# Patient Record
Sex: Female | Born: 1980 | Race: White | Hispanic: Yes | Marital: Single | Smoking: Never smoker
Health system: Southern US, Community
[De-identification: ages and names within clinical notes are randomized; demographics above are authoritative.]

## PROBLEM LIST (undated history)

## (undated) ENCOUNTER — Inpatient Hospital Stay (HOSPITAL_COMMUNITY): Payer: Self-pay

---
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
## 2001-03-28 ENCOUNTER — Encounter: Admission: RE | Admit: 2001-03-28 | Discharge: 2001-04-04 | Payer: Self-pay | Admitting: *Deleted

## 2006-01-26 ENCOUNTER — Ambulatory Visit: Payer: Self-pay | Admitting: Nurse Practitioner

## 2006-01-30 ENCOUNTER — Ambulatory Visit: Payer: Self-pay | Admitting: *Deleted

## 2006-04-20 ENCOUNTER — Ambulatory Visit: Payer: Self-pay | Admitting: Nurse Practitioner

## 2006-06-19 LAB — OB RESULTS CONSOLE VARICELLA ZOSTER ANTIBODY, IGG
VARICELLA IGG: IMMUNE
VARICELLA IGG: IMMUNE

## 2006-07-06 ENCOUNTER — Ambulatory Visit (HOSPITAL_COMMUNITY): Admission: RE | Admit: 2006-07-06 | Discharge: 2006-07-06 | Payer: Self-pay | Admitting: Gynecology

## 2006-07-09 ENCOUNTER — Ambulatory Visit: Payer: Self-pay | Admitting: Obstetrics & Gynecology

## 2006-07-23 ENCOUNTER — Ambulatory Visit: Payer: Self-pay | Admitting: Obstetrics & Gynecology

## 2006-07-26 ENCOUNTER — Ambulatory Visit: Payer: Self-pay | Admitting: Family Medicine

## 2006-08-02 ENCOUNTER — Ambulatory Visit: Payer: Self-pay | Admitting: *Deleted

## 2006-08-09 ENCOUNTER — Ambulatory Visit: Payer: Self-pay | Admitting: Family Medicine

## 2006-08-16 ENCOUNTER — Ambulatory Visit: Payer: Self-pay | Admitting: Family Medicine

## 2006-08-23 ENCOUNTER — Ambulatory Visit: Payer: Self-pay | Admitting: Family Medicine

## 2006-08-30 ENCOUNTER — Ambulatory Visit: Payer: Self-pay | Admitting: Gynecology

## 2006-09-06 ENCOUNTER — Ambulatory Visit (HOSPITAL_COMMUNITY): Admission: RE | Admit: 2006-09-06 | Discharge: 2006-09-06 | Payer: Self-pay | Admitting: Family Medicine

## 2006-09-06 ENCOUNTER — Ambulatory Visit: Payer: Self-pay | Admitting: Family Medicine

## 2006-09-13 ENCOUNTER — Ambulatory Visit: Payer: Self-pay | Admitting: Family Medicine

## 2006-09-20 ENCOUNTER — Ambulatory Visit: Payer: Self-pay | Admitting: Family Medicine

## 2006-09-27 ENCOUNTER — Ambulatory Visit: Payer: Self-pay | Admitting: Family Medicine

## 2006-10-04 ENCOUNTER — Ambulatory Visit: Payer: Self-pay | Admitting: Family Medicine

## 2006-10-11 ENCOUNTER — Ambulatory Visit: Payer: Self-pay | Admitting: Family Medicine

## 2006-10-17 ENCOUNTER — Ambulatory Visit: Payer: Self-pay | Admitting: Gynecology

## 2006-10-25 ENCOUNTER — Ambulatory Visit: Payer: Self-pay | Admitting: Obstetrics & Gynecology

## 2006-11-01 ENCOUNTER — Ambulatory Visit: Payer: Self-pay | Admitting: Family Medicine

## 2006-11-08 ENCOUNTER — Ambulatory Visit: Payer: Self-pay | Admitting: Family Medicine

## 2006-11-08 ENCOUNTER — Ambulatory Visit (HOSPITAL_COMMUNITY): Admission: RE | Admit: 2006-11-08 | Discharge: 2006-11-08 | Payer: Self-pay | Admitting: Family Medicine

## 2006-11-09 ENCOUNTER — Inpatient Hospital Stay (HOSPITAL_COMMUNITY): Admission: AD | Admit: 2006-11-09 | Discharge: 2006-11-12 | Payer: Self-pay | Admitting: Gynecology

## 2006-11-09 ENCOUNTER — Ambulatory Visit: Payer: Self-pay | Admitting: Gynecology

## 2007-10-10 IMAGING — US US OB COMP +14 WK
1 series · 13 of 28 positions shown · non-contrast
Comparison: none

CLINICAL DATA: 18 week 5 day gestational age by LMP.  Evaluate dating and anatomy.

[Series 1: us ob comp +14 wk · 0.27mm/px · 13 of 114 slices shown]
[im 5/114]
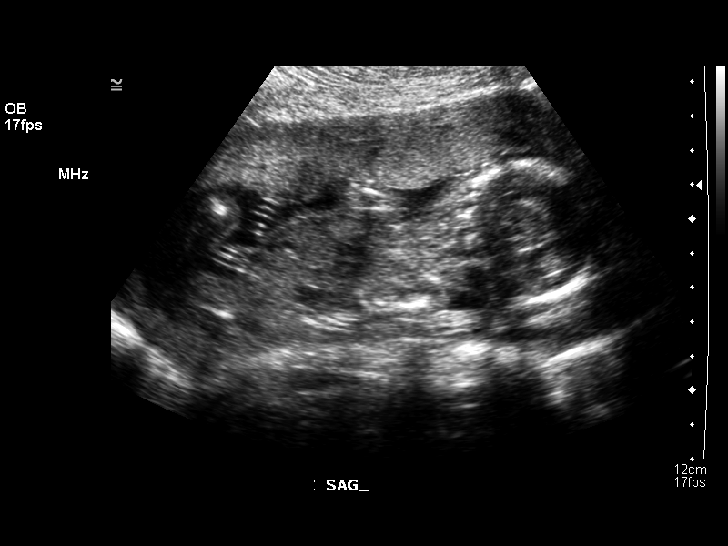
[im 13/114]
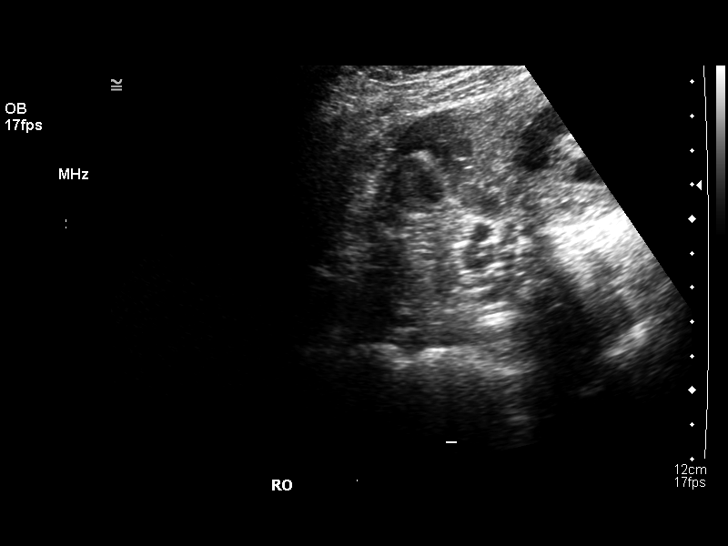
[im 21/114]
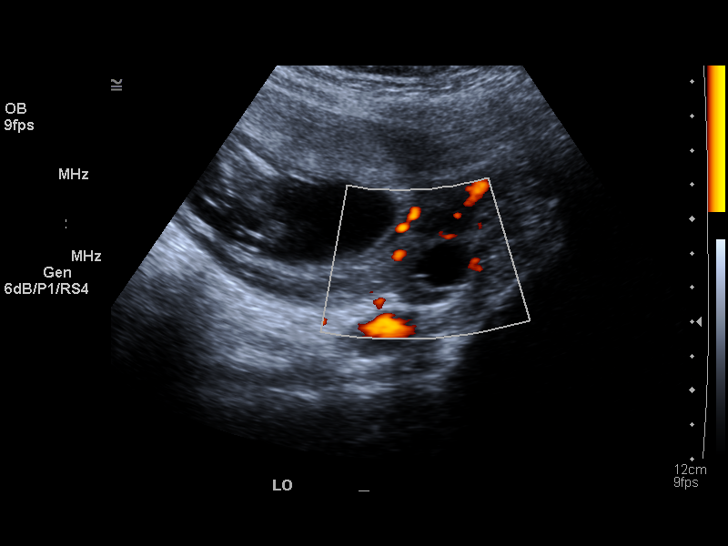
[im 30/114]
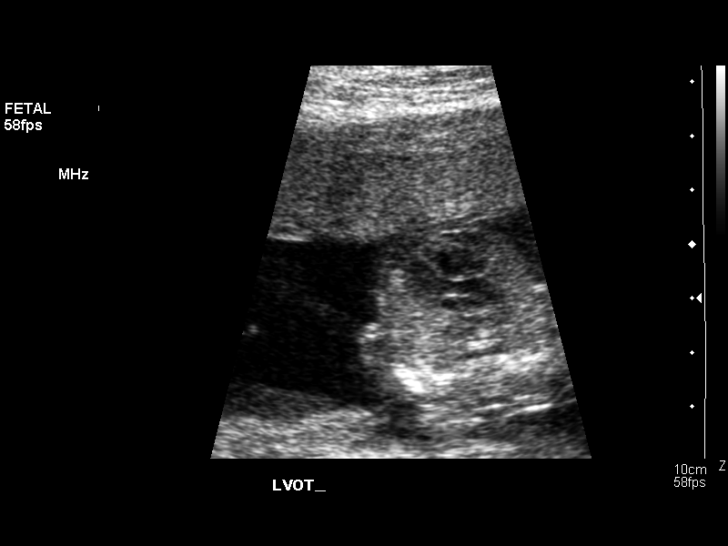
[im 38/114]
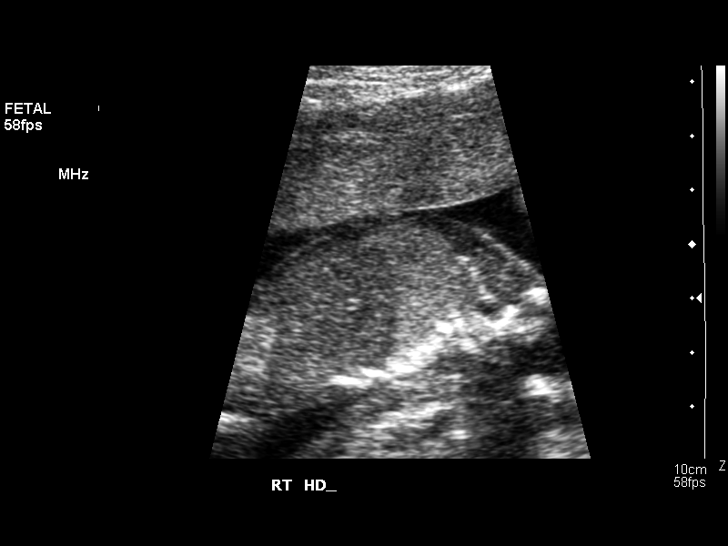
[im 47/114]
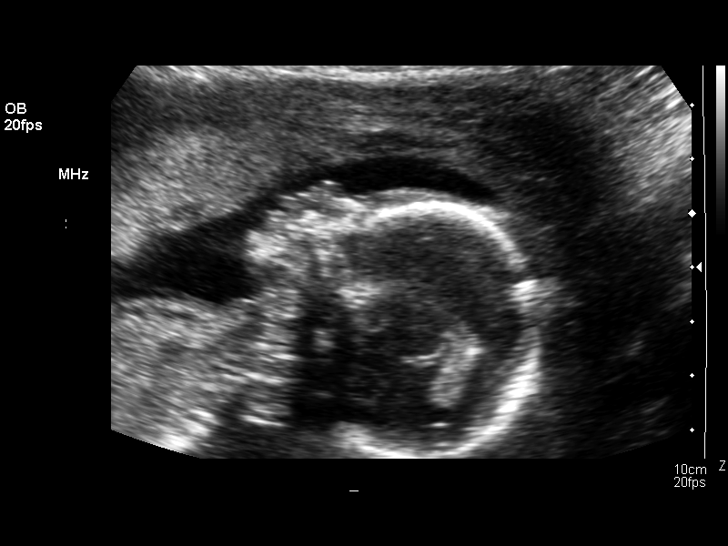
[im 59/114]
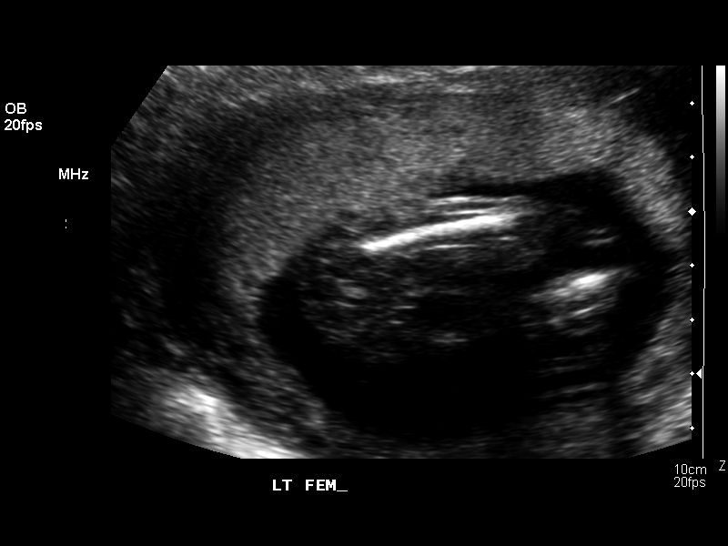
[im 67/114]
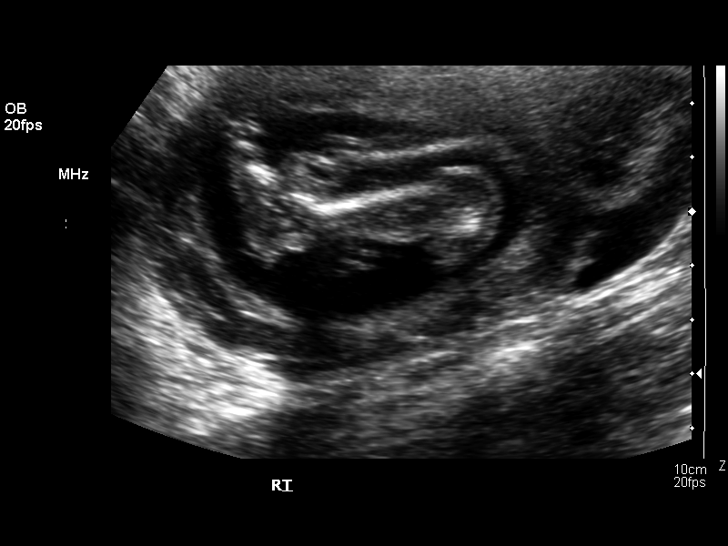
[im 76/114]
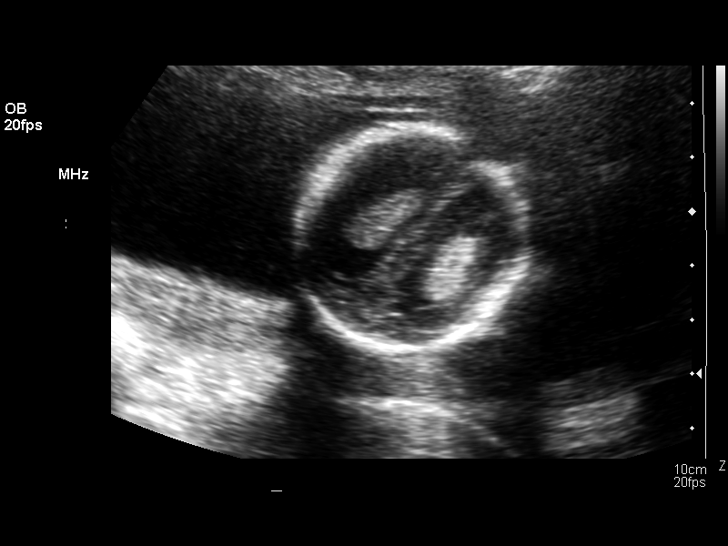
[im 84/114]
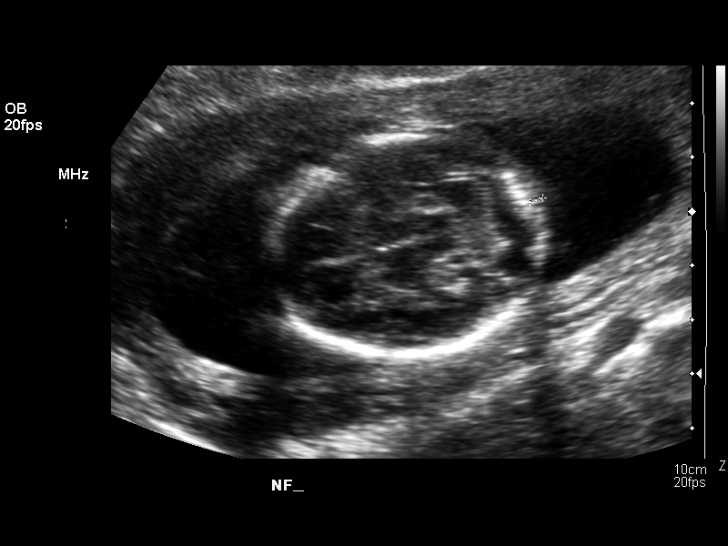
[im 93/114]
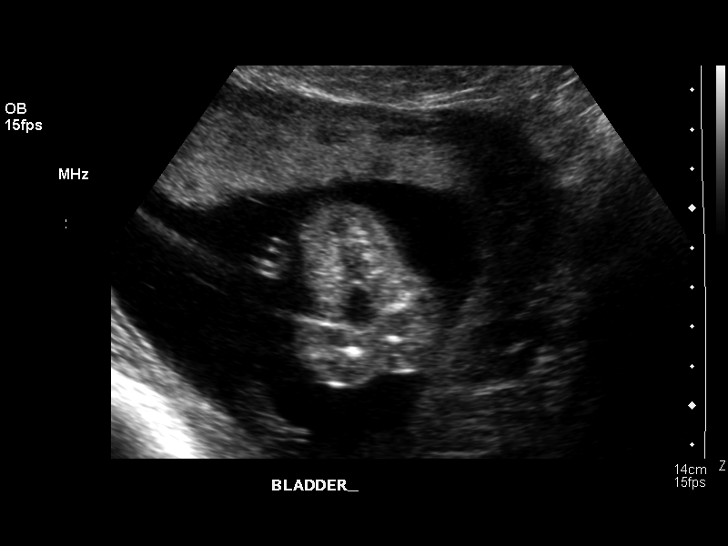
[im 101/114]
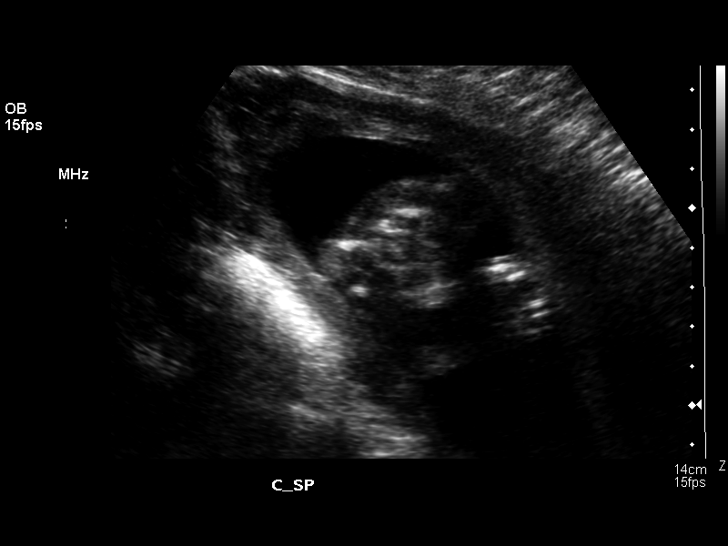
[im 109/114]
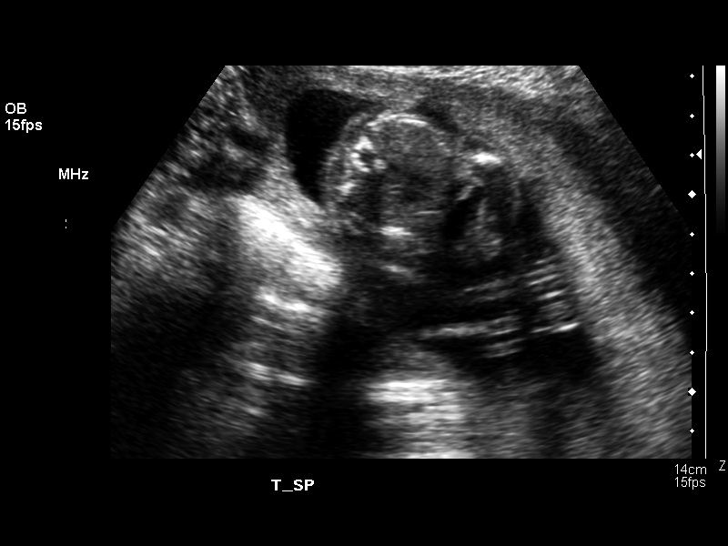

[13 of 28 positions shown; findings below may reference images not displayed]

OBSTETRICAL ULTRASOUND:
Number of Fetuses:  1
Heart Rate:  144 bpm
Movement:  Yes
Breathing:  No  
Presentation:  Variable
Placental Location:  Anterior
Grade:  I
Previa:  No
Amniotic Fluid (Subjective):  Normal
Amniotic Fluid (Objective):  3.2 cm vertical pocket 

FETAL BIOMETRY
BPD:  4.0 cm  18 w 3 d
HC:  14.8 cm  18 w 0 d
AC:  12.1 cm  17 w 6 d
FL:  2.7 cm   18 w 3 d

MEAN GA:  18 w 1 d  US EDC:  12/06/06

FETAL ANATOMY
Lateral Ventricles:  Visualized 
Thalami/CSP:  Visualized
Posterior Fossa:  Visualized   
Nuchal Region  NF = 4 mm  Visualized 
Spine:  Visualized  
4 Chamber Heart on Left:  Visualized  
Stomach on Left:  Visualized    
3 Vessel Cord:  Visualized 
Cord Insertion site:  Visualized 
Kidneys:  Visualized   
Bladder:  Visualized   
Extremities:  Visualized 

ADDITIONAL ANATOMY VISUALIZED:  LVOT, RVOT, upper lip, orbits, profile, diaphragm, both heels, ductal arch, and aortic arch.

MATERNAL UTERINE AND ADNEXAL FINDINGS
Cervix:  3.3 cm transabdominal.

Both ovaries are unremarkable.
IMPRESSION: 1.  Single living intrauterine fetus with mean gestational age of 18 weeks 1 day and sonographic EDC of 12/06/06.  This is concordant with LMP.
2.  No evidence of fetal anatomic abnormality.

## 2009-06-30 LAB — OB RESULTS CONSOLE VARICELLA ZOSTER ANTIBODY, IGG: VARICELLA IGG: IMMUNE

## 2009-07-02 ENCOUNTER — Encounter: Payer: Self-pay | Admitting: Family Medicine

## 2009-07-02 ENCOUNTER — Ambulatory Visit (HOSPITAL_COMMUNITY): Admission: RE | Admit: 2009-07-02 | Discharge: 2009-07-02 | Payer: Self-pay | Admitting: Family Medicine

## 2009-09-10 ENCOUNTER — Inpatient Hospital Stay (HOSPITAL_COMMUNITY): Admission: AD | Admit: 2009-09-10 | Discharge: 2009-09-12 | Payer: Self-pay | Admitting: Obstetrics & Gynecology

## 2009-09-10 ENCOUNTER — Encounter: Payer: Self-pay | Admitting: Physician Assistant

## 2009-09-10 ENCOUNTER — Ambulatory Visit: Payer: Self-pay | Admitting: Physician Assistant

## 2011-03-02 LAB — STREP B DNA PROBE

## 2011-03-02 LAB — URINE CULTURE

## 2011-03-02 LAB — URINALYSIS, ROUTINE W REFLEX MICROSCOPIC
Bilirubin Urine: NEGATIVE
Glucose, UA: NEGATIVE mg/dL
Hgb urine dipstick: NEGATIVE
Ketones, ur: NEGATIVE mg/dL
Nitrite: NEGATIVE
pH: 5.5 (ref 5.0–8.0)

## 2011-03-02 LAB — CBC
HCT: 35.1 % — ABNORMAL LOW (ref 36.0–46.0)
Hemoglobin: 11.9 g/dL — ABNORMAL LOW (ref 12.0–15.0)
MCHC: 33.8 g/dL (ref 30.0–36.0)
RDW: 14.2 % (ref 11.5–15.5)

## 2011-04-14 NOTE — Op Note (Signed)
[facility]  Patient:    Michelle Rice Seen                  MRN: [id number] Proc. Date: 12/22/00 Adm. Date:  [id number] Attending:  Michaelle Copas                           Operative Report  PREOPERATIVE DIAGNOSES: 1. Intrauterine pregnancy at 26-[redacted] weeks gestation in active    preterm labor. 2. Breech presentation. 3. Positive Group B strep. 4. Probable placental separation.  POSTOPERATIVE DIAGNOSES: 1. Intrauterine pregnancy at 26-[redacted] weeks gestation in active    preterm labor, delivered. 2. Breech presentation. 3. Positive Group B strep. 4. Probable placental separation. 5. Subseptate uterus.  OPERATION:                    Low transverse cesarean section.  SURGEON:                      Conni Elliot, M.D.  ANESTHESIA:                   Spinal by Dr. ______.  DESCRIPTION OF PROCEDURE:     After the patient was placed with a spinal anesthetic, the patient placed in supine left lateral tilt position.  The patient received oxygen and the abdomen was prepped and draped in the sterile fashion.  A low transverse Pfannenstiel incision was made.  Incision was made through the skin and subcutaneous fascia.  The rectus muscles were separated in the midline and a bladder flap was created.  A low transverse uterine incision was made, the baby was delivered from the footling breech presentation by complete breech extraction without difficulty.  There was no head entrapment.  The cord was doubly clamped and cut.  The baby was handed to the neonatology attendants.  The placenta was delivered spontaneously. The uterus, bladder flap, anterior peritoneum and fascia was closed in the usual fashion.  Estimated blood loss was less than 70 cc. DD:  12/22/00 TD:  12/22/00 Job: [id number]

## 2011-04-14 NOTE — Discharge Summary (Signed)
[facility]  Patient:    Michelle Rice                    MRN: [id number] Adm. Date:  12/22/00 Disc. Date: 12/25/00 Attending:  Conni Elliot, M.D. Dictator:   Ebbie Ridge, M.D.                           Discharge Summary  DISCHARGE DIAGNOSES:          1. Delivery of a single liveborn infant.                               2. Status post low cervical cesarean section.                               3. Preterm labor.                               4. Cervicitis.                               5. Double footling breech presentation.                               6. Group B streptococcus infection.                               7. Congenital anomaly of the uterus.                               8. Upper respiratory infection.  DISCHARGE MEDICATIONS:        1. Ibuprofen 600 mg p.o. q.4-6h. p.r.n.                               2. Prenatal vitamins one p.o. q.d. for six                                  weeks.                               3. Duratuss HD two teaspoons q.4h. p.r.n.                               4. Depo-Provera 150 mg IM given.  HISTORY OF PRESENT ILLNESS:   Briefly, Ms. Michelle Rice is a 30 year old, G1, P0, Hispanic female who presented to MAU at 27-6/7 weeks, complaining of lower abdominal pain.  Tocometer revealed that she was having contractions every three minutes.  Ms. Michelle Rice had been seen in the MAU five days prior and diagnosed with cervicitis.  She was found to be group B streptococcus positive.  Attempts had been made to contact the patient at home, but were unsuccessful.  PHYSICAL EXAMINATION:         The cervix was long, thick, and closed.  HOSPITAL COURSE:              Ms. Michelle Rice was admitted to the hospital and started on IV Unasyn and Motrin.  Six hours later, she progressed to 5 cm dilation, 100% effacement, and had a doming bag.  IV magnesium sulfate was started and betamethasone was given.  The patient  developed significant vaginal bleeding and labor progressed.  Ultrasound revealed double footling breech presentation.  The patient was taken to the operating room for cesarean section later that evening.  She was found to have congenital anomaly of the uterus.  There were no complications of the operation.  The patient did have significant complaints of shortness of breath postoperatively.  A chest x-ray was done and was normal.  Her symptoms were felt to be secondary to an upper respiratory infection and the patient was treated symptomatically with Robitussin and Sudafed.  On the day of discharge, Ms. Michelle Rice was doing well.  Her pain was well controlled with ibuprofen.  She was pumping breast milk for her baby who remained in the NICU.  She will follow up with [facility] in six weeks for a postpartum check. DD:  05/21/01 TD:  05/21/01 Job: [id number]

## 2015-02-08 LAB — OB RESULTS CONSOLE RUBELLA ANTIBODY, IGM: RUBELLA: IMMUNE

## 2015-02-08 LAB — OB RESULTS CONSOLE RPR: RPR: NONREACTIVE

## 2015-02-08 LAB — OB RESULTS CONSOLE ABO/RH: RH TYPE: POSITIVE

## 2015-02-08 LAB — CYTOLOGY - PAP: PAP SMEAR: NEGATIVE

## 2015-02-08 LAB — OB RESULTS CONSOLE ANTIBODY SCREEN: ANTIBODY SCREEN: NEGATIVE

## 2015-02-08 LAB — GLUCOSE TOLERANCE, 1 HOUR (50G) W/O FASTING: Glucose, GTT - 1 Hour: 125 mg/dL (ref ?–200)

## 2015-02-08 LAB — OB RESULTS CONSOLE HIV ANTIBODY (ROUTINE TESTING): HIV: NONREACTIVE

## 2015-02-08 LAB — CYSTIC FIBROSIS DIAGNOSTIC STUDY: Interpretation-CFDNA:: NEGATIVE

## 2015-02-08 LAB — OB RESULTS CONSOLE HEPATITIS B SURFACE ANTIGEN: HEP B S AG: NEGATIVE

## 2015-02-08 LAB — SICKLE CELL SCREEN: SICKLE CELL SCREEN: NORMAL

## 2015-02-08 LAB — OB RESULTS CONSOLE GC/CHLAMYDIA: GC PROBE AMP, GENITAL: NEGATIVE

## 2015-02-10 ENCOUNTER — Other Ambulatory Visit: Payer: Self-pay | Admitting: Obstetrics & Gynecology

## 2015-02-10 DIAGNOSIS — Z3682 Encounter for antenatal screening for nuchal translucency: Secondary | ICD-10-CM

## 2015-02-15 ENCOUNTER — Encounter (HOSPITAL_COMMUNITY): Payer: Self-pay

## 2015-02-15 ENCOUNTER — Ambulatory Visit (HOSPITAL_COMMUNITY)
Admission: RE | Admit: 2015-02-15 | Discharge: 2015-02-15 | Disposition: A | Discharge status: Home / Self Care | Payer: Self-pay | Source: Ambulatory Visit | Attending: Physician Assistant | Admitting: Physician Assistant

## 2015-02-15 ENCOUNTER — Ambulatory Visit (HOSPITAL_COMMUNITY): Admission: RE | Admit: 2015-02-15 | Payer: Self-pay | Source: Ambulatory Visit

## 2015-02-15 ENCOUNTER — Other Ambulatory Visit: Payer: Self-pay | Admitting: Obstetrics & Gynecology

## 2015-02-15 DIAGNOSIS — Z3682 Encounter for antenatal screening for nuchal translucency: Secondary | ICD-10-CM

## 2015-02-15 DIAGNOSIS — O3421 Maternal care for scar from previous cesarean delivery: Secondary | ICD-10-CM | POA: Insufficient documentation

## 2015-02-15 DIAGNOSIS — Z3A13 13 weeks gestation of pregnancy: Secondary | ICD-10-CM | POA: Insufficient documentation

## 2015-02-15 DIAGNOSIS — O09291 Supervision of pregnancy with other poor reproductive or obstetric history, first trimester: Secondary | ICD-10-CM | POA: Insufficient documentation

## 2015-02-15 DIAGNOSIS — Z3A01 Less than 8 weeks gestation of pregnancy: Secondary | ICD-10-CM | POA: Insufficient documentation

## 2015-02-15 DIAGNOSIS — Z36 Encounter for antenatal screening of mother: Secondary | ICD-10-CM | POA: Insufficient documentation

## 2015-02-15 NOTE — ED Notes (Signed)
Spanish interpreter Silvia Sobalvarro with pt. 

## 2015-02-15 NOTE — ED Notes (Signed)
Pt reports cramping and small amt of bright red bleeding this morning.

## 2015-02-16 ENCOUNTER — Encounter (HOSPITAL_COMMUNITY): Payer: Self-pay | Admitting: *Deleted

## 2015-02-16 ENCOUNTER — Inpatient Hospital Stay (HOSPITAL_COMMUNITY)
Admission: AD | Admit: 2015-02-16 | Discharge: 2015-02-16 | Disposition: A | Discharge status: Home / Self Care | Payer: Self-pay | Source: Ambulatory Visit | Attending: Family Medicine | Admitting: Family Medicine

## 2015-02-16 ENCOUNTER — Inpatient Hospital Stay (HOSPITAL_COMMUNITY): Payer: Self-pay

## 2015-02-16 DIAGNOSIS — O039 Complete or unspecified spontaneous abortion without complication: Secondary | ICD-10-CM

## 2015-02-16 DIAGNOSIS — O208 Other hemorrhage in early pregnancy: Secondary | ICD-10-CM | POA: Insufficient documentation

## 2015-02-16 DIAGNOSIS — Z3A01 Less than 8 weeks gestation of pregnancy: Secondary | ICD-10-CM | POA: Insufficient documentation

## 2015-02-16 DIAGNOSIS — R109 Unspecified abdominal pain: Secondary | ICD-10-CM

## 2015-02-16 DIAGNOSIS — O209 Hemorrhage in early pregnancy, unspecified: Secondary | ICD-10-CM

## 2015-02-16 DIAGNOSIS — O26899 Other specified pregnancy related conditions, unspecified trimester: Secondary | ICD-10-CM

## 2015-02-16 LAB — WET PREP, GENITAL
CLUE CELLS WET PREP: NONE SEEN
Trich, Wet Prep: NONE SEEN
Yeast Wet Prep HPF POC: NONE SEEN

## 2015-02-16 LAB — CBC
HEMATOCRIT: 35.3 % — AB (ref 36.0–46.0)
Hemoglobin: 11.7 g/dL — ABNORMAL LOW (ref 12.0–15.0)
MCH: 28.5 pg (ref 26.0–34.0)
MCHC: 33.1 g/dL (ref 30.0–36.0)
MCV: 86.1 fL (ref 78.0–100.0)
PLATELETS: 235 10*3/uL (ref 150–400)
RBC: 4.1 MIL/uL (ref 3.87–5.11)
RDW: 13.5 % (ref 11.5–15.5)
WBC: 9.5 10*3/uL (ref 4.0–10.5)

## 2015-02-16 LAB — ABO/RH: ABO/RH(D): B POS

## 2015-02-16 MED ORDER — IBUPROFEN 200 MG PO CAPS: 600.0000 mg | ORAL_CAPSULE | Freq: Four times a day (QID) | ORAL | Status: DC | PRN

## 2015-02-16 NOTE — Discharge Instructions (Signed)
Aborto espontáneo  °(Miscarriage) °El aborto espontáneo es la pérdida de un bebé que no ha nacido (feto) antes de la semana 20 del embarazo. La mayor parte de estos abortos ocurre en los primeros 3 meses. En algunos casos ocurre antes de que la mujer sepa que está embarazada. También se denomina "aborto espontáneo" o "pérdida prematura del embarazo". El aborto espontáneo puede ser una experiencia que afecte emocionalmente a la persona. Converse con su médico si tiene dudas, cómo es el proceso de duelo, y sobre planes futuros de embarazo.  °CAUSAS  °· Algunos problemas cromosómicos pueden hacer imposible que el bebé se desarrolle normalmente. Los problemas con los genes o cromosomas del bebé son generalmente el resultado de errores que se producen, por casualidad, cuando el embrión se divide y crece. Estos problemas no se heredan de los padres. °· Infección en el cuello del útero.   °· Problemas hormonales.   °· Problemas en el cuello del útero, como tener un útero incompetente. Esto ocurre cuando los tejidos no son lo suficientemente fuertes como para contener el embarazo.   °· Problemas del útero, como un útero con forma anormal, los fibromas o anormalidades congénitas.   °· Ciertas enfermedades crónicas.   °· No fume, no beba alcohol, ni consuma drogas.   °· Traumatismos   °A veces, la causa es desconocida.  °SÍNTOMAS  °· Sangrado o manchado vaginal, con o sin cólicos o dolor. °· Dolor o cólicos en el abdomen o en la cintura. °· Eliminación de líquido, tejidos o coágulos grandes por la vagina. °DIAGNÓSTICO  °El médico le hará un examen físico. También le indicará una ecografía para confirmar el aborto. Es posible que se realicen análisis de sangre.  °TRATAMIENTO  °· En algunos casos el tratamiento no es necesario, si se eliminan naturalmente todos los tejidos embrionarios que se encontraban en el útero. Si el feto o la placenta quedan dentro del útero (aborto incompleto), pueden infectarse, los tejidos que quedan  pueden infectarse y deben retirarse. Generalmente se realiza un procedimiento de dilatación y curetaje (D y C). Durante el procedimiento de dilatación y curetaje, el cuello del útero se abre (dilata) y se retira cualquier resto de tejido fetal o placentario del útero. °· Si hay una infección, le recetarán antibióticos. Podrán recetarle otros medicamentos para reducir el tamaño del útero (contraerlo) si hay una mucho sangrado. °· Si su sangre es Rh negativa y su bebé es Rh positivo, usted necesitará la inyección de inmunoglobulina Rh. Esta inyección protegerá a los futuros bebés de tener problemas de compatibilidad Rh en futuros embarazos. °INSTRUCCIONES PARA EL CUIDADO EN EL HOGAR  °· El médico le indicará reposo en cama o le permitirá realizar actividades livianas. Vuelva a la actividad lentamente o según las indicaciones de su médico. °· Pídale a alguien que la ayude con las responsabilidades familiares y del hogar durante este tiempo.   °· Lleve un registro de la cantidad y la saturación de las toallas higiénicas que utiliza cada día. Anote esta información   °· No use tampones. No No se haga duchas vaginales ni tenga relaciones sexuales hasta que el médico la autorice.   °· Sólo tome medicamentos de venta libre o recetados para calmar el dolor o el malestar, según las indicaciones de su médico.   °· No tome aspirina. La aspirina puede ocasionar hemorragias.   °· Concurra puntualmente a las citas de control con el médico.   °· Si usted o su pareja tienen dificultades con el duelo, hable con su médico para buscar la ayuda psicológica que los ayude a enfrentar la pérdida   del embarazo. Permítase el tiempo suficiente de duelo antes de quedar embarazada nuevamente.   °SOLICITE ATENCIÓN MÉDICA DE INMEDIATO SI:  °· Siente calambres intensos o dolor en la espalda o en el abdomen. °· Tiene fiebre. °· Elimina grandes coágulos de sangre (del tamaño de una nuez o más) o tejidos por la vagina. Guarde lo que ha eliminado para  que su médico lo examine.   °· La hemorragia aumenta.   °· Observa una secreción vaginal espesa y con mal olor. °· Se siente mareada, débil, o se desmaya.   °· Siente escalofríos.   °ASEGÚRESE DE QUE:  °· Comprende estas instrucciones. °· Controlará su enfermedad. °· Solicitará ayuda de inmediato si no mejora o si empeora. °Document Released: 08/23/2005 Document Revised: 03/10/2013 °ExitCare® Patient Information ©2015 [employer]. This information is not intended to replace advice given to you by your health care provider. Make sure you discuss any questions you have with your health care provider. ° °

## 2015-02-16 NOTE — MAU Note (Addendum)
PT WAS  IN MFM YESTERDAY  FOR AN U/S- AFTER GOING  TO   CLINIC - ASKING  THEM WHAT  TO DO ABOUT HER BLEEDING.    SHE WAS SEEN AT HD ON 3-14  --- MADE APPOINTMENT  WITH MFM.     HAS AN APPOINTMENT  WITH CLINIC  ON 4-7         IN ROOM 3-  NO BLEEDING ON PERINEUM  BUT  HAS DRIED BLOOD SMEARS ON THIGHS.     SAYS SHE STARTED VAG BLEEDING YESTERDAY AM.   MINOR CRAMPS  YESTERDAY BUT MORE CRAMPS AND BLEEDING  TODAY.     NOW  WITH INTERPRETER - EDA-  SAYS SHE HAS PASSED CLOTS- WITH JELLO LIKE  SUBSTANCE IN CLOTS.

## 2015-02-16 NOTE — MAU Provider Note (Deleted)
History     CSN: [id number]  Arrival date and time: 02/16/15 [id number]   First Provider Initiated Contact with Patient 02/16/15 [phone]      No chief complaint on file.  HPI  Michelle Rice is a 34 y.o.HF [id number], at 3860w6d presenting with pelvic cramping and vaginal bleeding since yesterday morning. She reports a heavier flow of bleeding and clots that has caused her to change her pad 5-6 times since yesterday. The pelvic cramping feels exactly like the contractions she felt when she had her 3 children previously. Contractions are occuring every few minutes and have been occurring while in MAU. Pt had a transvaginal US yesterday for the same symptoms. A small fetal pole was noted with no fetal heart activity.      OB History    Gravida Para Term Preterm AB TAB SAB Ectopic Multiple Living   4 3 1 2      3       Past Medical History  Diagnosis Date  . Medical history non-contributory   . Preterm labor     Past Surgical History  Procedure Laterality Date  . Cesarean section      Family History  Problem Relation Age of Onset  . Hypertension Father     History  Substance Use Topics  . Smoking status: Never Smoker   . Smokeless tobacco: Not on file  . Alcohol Use: No    Allergies: No Known Allergies  Prescriptions prior to admission  Medication Sig Dispense Refill Last Dose  . Prenatal Vit-Fe Fumarate-FA (PRENATAL VITAMIN PO) Take 1 tablet by mouth daily.    02/16/2015 at Unknown time    Review of Systems  Constitutional: Negative.  Negative for fever, chills and weight loss.  HENT: Negative.   Eyes: Negative.   Respiratory: Negative for cough and wheezing.   Cardiovascular: Negative for chest pain, palpitations, orthopnea and leg swelling.  Gastrointestinal: Negative.   Genitourinary: Negative for dysuria, urgency and frequency.  Musculoskeletal: Negative for myalgias.       Describes pelvic contractions every few minutes.  Skin: Negative.   Neurological:  Negative.  Negative for headaches.  Endo/Heme/Allergies: Negative.   Psychiatric/Behavioral: Negative.    Physical Exam   Blood pressure 98/70, pulse 60, temperature 98.3 F (36.8 C), temperature source Oral, resp. rate 18, height 5\' 1"  (1.549 m), weight 81.307 kg (179 lb 4 oz), last menstrual period 11/16/2014.  Physical Exam  Constitutional: She is oriented to person, place, and time. She appears well-developed and well-nourished. No distress.  Overweight  HENT:  Head: Normocephalic and atraumatic.  Eyes: Conjunctivae and EOM are normal. Pupils are equal, round, and reactive to light.  Neck: Normal range of motion. Neck supple.  Cardiovascular: Normal rate, regular rhythm, normal heart sounds and intact distal pulses.   Respiratory: Effort normal and breath sounds normal.  GI: Soft. Bowel sounds are normal. She exhibits no distension and no mass. There is no tenderness. There is no rebound and no guarding. Hernia confirmed negative in the right inguinal area and confirmed negative in the left inguinal area.  Genitourinary: Uterus normal. There is no rash, tenderness, lesion or injury on the right labia. There is no rash, tenderness, lesion or injury on the left labia. Cervix exhibits discharge. Cervix exhibits no motion tenderness and no friability. Right adnexum displays no mass, no tenderness and no fullness. Left adnexum displays no mass, no tenderness and no fullness. There is bleeding in the vagina. No erythema or tenderness in the vagina. No  foreign body around the vagina. No signs of injury around the vagina. No vaginal discharge found.  Copious amounts of blood in vagina and bleeding noted from cervix. 15 cotton swabs used in order to visualize cervix. No fetal contents noted to be coming from cervical os at first look.  Musculoskeletal: Normal range of motion. She exhibits no edema.  Lymphadenopathy:    She has no cervical adenopathy.  Neurological: She is alert and oriented to  person, place, and time.  Skin: Skin is warm and dry. She is not diaphoretic.  Psychiatric: She has a normal mood and affect. Her behavior is normal.    MAU Course  Procedures  MDM    Assessment and Plan    Acie Fredrickson 02/16/2015, 9:31 AM

## 2015-02-17 LAB — GC/CHLAMYDIA PROBE AMP (~~LOC~~) NOT AT ARMC
CHLAMYDIA, DNA PROBE: NEGATIVE
NEISSERIA GONORRHEA: NEGATIVE

## 2015-02-22 ENCOUNTER — Ambulatory Visit (HOSPITAL_COMMUNITY): Admission: RE | Admit: 2015-02-22 | Payer: Self-pay | Source: Ambulatory Visit

## 2015-02-22 ENCOUNTER — Encounter: Payer: Self-pay | Admitting: *Deleted

## 2015-02-23 NOTE — MAU Provider Note (Signed)
History     CSN: [id number]  Arrival date and time: 02/16/15 0865   First Provider Initiated Contact with Patient 02/16/15 [phone]      No chief complaint on file.  HPI  Michelle Rice is a 34 y.o.HF [id number], at [redacted]w[redacted]d presenting with pelvic cramping and vaginal bleeding since yesterday morning. She reports a heavier flow of bleeding and clots that has caused her to change her pad 5-6 times since yesterday. Denies passage of tissue. The pelvic cramping feels exactly like the contractions she felt when she had her 3 children previously. Contractions are occuring every few minutes and have been occurring while in MAU. Pt had a transvaginal US yesterday for the same symptoms. A 5.6 week fetal pole was noted with no fetal heart activity.  B pos   OB History    Gravida Para Term Preterm AB TAB SAB Ectopic Multiple Living   Past Medical History  Diagnosis Date  . Medical history non-contributory   . Preterm labor     Past Surgical History  Procedure Laterality Date  . Cesarean section      Family History  Problem Relation Age of Onset  . Hypertension Father     History  Substance Use Topics  . Smoking status: Never Smoker   . Smokeless tobacco: Not on file  . Alcohol Use: No    Allergies: No Known Allergies  Prescriptions prior to admission  Medication Sig Dispense Refill Last Dose  . Prenatal Vit-Fe Fumarate-FA (PRENATAL VITAMIN PO) Take 1 tablet by mouth daily.    02/16/2015 at Unknown time    Review of Systems  Constitutional: Negative.  Negative for fever, chills and weight loss.  HENT: Negative.   Eyes: Negative.   Respiratory: Negative for cough and wheezing.   Cardiovascular: Negative for chest pain, palpitations, orthopnea and leg swelling.  Gastrointestinal: Negative.   Genitourinary: Negative for dysuria, urgency and frequency.  Musculoskeletal: Negative for myalgias.       Describes pelvic contractions every few minutes.   Skin: Negative.   Neurological: Negative.  Negative for headaches.  Endo/Heme/Allergies: Negative.   Psychiatric/Behavioral: Negative.    Physical Exam   Blood pressure 98/70, pulse 60, temperature 98.3 F (36.8 C), temperature source Oral, resp. rate 18, height  (1.549 m), weight 81.307 kg (179 lb 4 oz), last menstrual period 11/16/2014.  Physical Exam  Constitutional: She is oriented to person, place, and time. She appears well-developed and well-nourished. No distress.  Overweight  HENT:  Head: Normocephalic and atraumatic.  Eyes: Conjunctivae and EOM are normal. Pupils are equal, round, and reactive to light.  Neck: Normal range of motion. Neck supple.  Cardiovascular: Normal rate, regular rhythm, normal heart sounds and intact distal pulses.   Respiratory: Effort normal and breath sounds normal.  GI: Soft. Bowel sounds are normal. She exhibits no distension and no mass. There is no tenderness. There is no rebound and no guarding. Hernia confirmed negative in the right inguinal area and confirmed negative in the left inguinal area.  Genitourinary: Uterus normal. There is no rash, tenderness, lesion or injury on the right labia. There is no rash, tenderness, lesion or injury on the left labia. Cervix exhibits discharge. Cervix exhibits no motion tenderness and no friability. Right adnexum displays no mass, no tenderness and no fullness. Left adnexum displays no mass, no tenderness and no fullness. There is bleeding in the vagina. No erythema or  tenderness in the vagina. No foreign body around the vagina. No signs of injury around the vagina. No vaginal discharge found.  Copious amounts of blood in vagina and bleeding noted from cervix. 15 cotton swabs used in order to visualize cervix. No fetal contents noted to be coming from cervical os at first look.  Musculoskeletal: Normal range of motion. She exhibits no edema.  Lymphadenopathy:    She has no cervical adenopathy.   Neurological: She is alert and oriented to person, place, and time.  Skin: Skin is warm and dry. She is not diaphoretic.  Psychiatric: She has a normal mood and affect. Her behavior is normal.   Results for Naoma DienerGARCIA-ALTAMIRANO, Sunita (MRN [id number]) as of 02/23/2015 14:14  Ref. Range 02/16/2015 09:25 02/16/2015 09:47  WBC Latest Range: 4.0-10.5 K/uL 9.5   RBC Latest Range: 3.87-5.11 MIL/uL 4.10   Hemoglobin Latest Range: 12.0-15.0 g/dL 96.211.7 (L)   HCT Latest Range: 36.0-46.0 % 35.3 (L)   MCV Latest Range: 78.0-100.0 fL 86.1   MCH Latest Range: 26.0-34.0 pg 28.5   MCHC Latest Range: 30.0-36.0 g/dL 95.233.1   RDW Latest Range: 11.5-15.5 % 13.5   Platelets Latest Range: 150-400 K/uL 235   ABO/RH(D) No range found B POS   Yeast Wet Prep HPF POC Latest Range: NONE SEEN   NONE SEEN  Trich, Wet Prep Latest Range: NONE SEEN   NONE SEEN  Clue Cells Wet Prep HPF POC Latest Range: NONE SEEN   NONE SEEN  WBC, Wet Prep HPF POC Latest Range: NONE SEEN   FEW (A)   [place] Ob Transvaginal  02/16/2015   CLINICAL DATA:  Vaginal bleeding. Passing clots. Quantitative beta HCG not ordered. Gravida 4 para 3. LMP 11/16/2014. GA by LMP is 13 weeks 1 day. EDC by LMP is 08/23/2015. By first ultrasound patient is 5 weeks 6 days and has EDC of 10/13/2015.  EXAM: TRANSVAGINAL OB ULTRASOUND  TECHNIQUE: Transvaginal ultrasound was performed for complete evaluation of the gestation as well as the maternal uterus, adnexal regions, and pelvic cul-de-sac.  COMPARISON:  02/15/2015  FINDINGS: Intrauterine gestational sac: Present within the cervical canal.  Yolk sac:  Not seen  Embryo:  Not seen  Cardiac Activity: Not seen  A 3.0 x 1.2 x 2.2 cm sac is identified within the cervical canal. Yolk sac and embryo are no longer apparent. Above the sac is heterogeneous endometrial contents, measuring 2.4 cm consistent with moderate subchorionic hemorrhage. The right ovary is 2.3 x 1.4 x 1.7 cm and has a normal appearance. Left ovary is 2.9 x 2.2 x  1.9 cm.  IMPRESSION: 1. Intrauterine gestational sac is now visible within the cervical canal, consistent with impending spontaneous abortion. 2. Yolk sac and embryo are no longer visible. 3. Moderate subchorionic hemorrhage identified as heterogeneous endometrial contents above the gestational sac. Endometrium and contents measures 2.4 cm. 4. Ovaries are normal in appearance.   Electronically Signed   By: Norva PavlovElizabeth  Brown M.D.   On: 02/16/2015 11:12   MAU Course  MDM  When pt returned from US Pelvic exam was repeated. GS was coming through os and was successfully removed w/ ring forceps. Fetus visualized.  Bleeding decreased dramatically afterward. Pt requesting D/C. ambulating without dizziness.  Assessment and Plan   1. Complete miscarriage   2. Abdominal pain affecting pregnancy, antepartum   3. Vaginal bleeding in pregnancy, first trimester    PLAN: Discharge home in stable condition. Support given. Chaplain visit offered, declined. Follow-up Information    Follow up  with [facility].   Specialty:  Obstetrics and Gynecology   Contact information:   [address] [place] [address] [phone]      Please follow up.   Why:  As scheduled for follow-up miscarriage appointment      Follow up with [facility] [place] [facility]ERN[facility] ADMISS[facility].   Why:  As needed in emergencies   Contact information:   [address] [id number] mc [place] [address] [phone]        Medication List    TAKE these medications        Ibuprofen 200 MG Caps  Commonly known as:  ADVIL  Take 3 capsules (600 mg total) by mouth every 6 (six) hours as needed.     PRENATAL VITAMIN PO  Take 1 tablet by mouth daily.       [place], CNM 02/23/2015 2:22 PM

## 2015-03-04 ENCOUNTER — Encounter: Payer: Self-pay | Admitting: Family Medicine

## 2015-03-10 ENCOUNTER — Encounter: Payer: Self-pay | Admitting: *Deleted

## 2015-03-15 ENCOUNTER — Encounter: Payer: Self-pay | Admitting: Medical

## 2015-03-15 ENCOUNTER — Ambulatory Visit (INDEPENDENT_AMBULATORY_CARE_PROVIDER_SITE_OTHER): Payer: Self-pay | Admitting: Medical

## 2015-03-15 VITALS — BP 106/61 | HR 68 | Temp 98.3°F | Wt 182.5 lb

## 2015-03-15 DIAGNOSIS — Z3009 Encounter for other general counseling and advice on contraception: Secondary | ICD-10-CM

## 2015-03-15 DIAGNOSIS — Z309 Encounter for contraceptive management, unspecified: Secondary | ICD-10-CM

## 2015-03-15 DIAGNOSIS — O039 Complete or unspecified spontaneous abortion without complication: Secondary | ICD-10-CM

## 2015-03-15 MED ORDER — NORGESTIMATE-ETH ESTRADIOL 0.25-35 MG-MCG PO TABS: 1.0000 | ORAL_TABLET | Freq: Every day | ORAL | Status: DC

## 2015-03-15 NOTE — Progress Notes (Signed)
Patient ID: [name], female   DOB: 03/03/1981, 34 y.o.   MRN: [id number]  History:  Ms. [name]  is a 34 y.o. [phone] who presents to clinic today for follow-up after SAB. The patient was seen in MAU on 02/16/15. She states that bleeding stopped after that day aside from light spotting for 2-3 days. She denies bleeding or pain today. She has not yet had a period. She denies history of HTN, clots or other significant chronic illnesses.   Past Medical History  Diagnosis Date  . Medical history non-contributory   . Preterm labor    History   Social History  . Marital Status: Single    Spouse Name: N/A  . Number of Children: N/A  . Years of Education: N/A   Occupational History  . Not on file.   Social History Main Topics  . Smoking status: Never Smoker   . Smokeless tobacco: Never Used  . Alcohol Use: No  . Drug Use: No  . Sexual Activity: Not Currently   Other Topics Concern  . Not on file   Social History Narrative   Family History  Problem Relation Age of Onset  . Hypertension Father    No Known Allergies  Current Outpatient Prescriptions on File Prior to Visit  Medication Sig Dispense Refill  . Ibuprofen (ADVIL) 200 MG CAPS Take 3 capsules (600 mg total) by mouth every 6 (six) hours as needed. 30 each 0  . Prenatal Vit-Fe Fumarate-FA (PRENATAL VITAMIN PO) Take 1 tablet by mouth daily.      No current facility-administered medications on file prior to visit.     The following portions of the patient's history were reviewed and updated as appropriate: allergies, current medications, past family history, past medical history, past social history, past surgical history and problem list.  Review of Systems:    Objective:  Physical Exam BP 106/61 mmHg  Pulse 68  Temp(Src) 98.3 F (36.8 C) (Oral)  Wt 182 lb 8 oz (82.781 kg)  LMP 11/16/2014  Breastfeeding? Unknown CONSTITUTIONAL: Well-developed, well-nourished female in no acute  distress.  EYES: EOM intact ENT: Normocephalic, atraumatic.  RESPIRATORY: Normal effort CARDIOVASCULAR: Regular rate  GASTROINTESTINAL: Soft, nontender, nondistended. SKIN: warm, dry without erythema PSYCH: Normal mood and affect  Labs and Imaging [place] Ob Transvaginal  02/16/2015   CLINICAL DATA:  Vaginal bleeding. Passing clots. Quantitative beta HCG not ordered. Gravida 4 para 3. LMP 11/16/2014. GA by LMP is 13 weeks 1 day. EDC by LMP is 08/23/2015. By first ultrasound patient is 5 weeks 6 days and has EDC of 10/13/2015.  EXAM: TRANSVAGINAL OB ULTRASOUND  TECHNIQUE: Transvaginal ultrasound was performed for complete evaluation of the gestation as well as the maternal uterus, adnexal regions, and pelvic cul-de-sac.  COMPARISON:  02/15/2015  FINDINGS: Intrauterine gestational sac: Present within the cervical canal.  Yolk sac:  Not seen  Embryo:  Not seen  Cardiac Activity: Not seen  A 3.0 x 1.2 x 2.2 cm sac is identified within the cervical canal. Yolk sac and embryo are no longer apparent. Above the sac is heterogeneous endometrial contents, measuring 2.4 cm consistent with moderate subchorionic hemorrhage. The right ovary is 2.3 x 1.4 x 1.7 cm and has a normal appearance. Left ovary is 2.9 x 2.2 x 1.9 cm.  IMPRESSION: 1. Intrauterine gestational sac is now visible within the cervical canal, consistent with impending spontaneous abortion. 2. Yolk sac and embryo are no longer visible. 3. Moderate subchorionic hemorrhage identified as heterogeneous endometrial contents  above the gestational sac. Endometrium and contents measures 2.4 cm. 4. Ovaries are normal in appearance.   Electronically Signed   By: Norva Pavlov M.D.   On: 02/16/2015 11:12   [place] Mfm Ob Comp Less 14 Wks  02/15/2015   OBSTETRICAL ULTRASOUND: This exam was performed within a [facility]. The OB US report was generated in the AS system, and faxed to the ordering physician.   This report is available in the [employer]. See the AS Obstetric US report via the Image Link.  [place] Mfm Ob Transvaginal  02/15/2015   OBSTETRICAL ULTRASOUND: This exam was performed within a [facility]. The OB US report was generated in the AS system, and faxed to the ordering physician.   This report is available in the [employer]. See the AS Obstetric US report via the Image Link.   Assessment & Plan:  Assessment: Recent SAB Birth control counseling  Plans: Quantitative hCG ordered today Rx for Sprintec sent to patient's pharmacy. Patient advised to wait until called with normal hCG results prior to starting OCPs Discussed appropriate use of OCPs, need for condoms as back-up contraception for first month Patient to return to [facility] or [facility] in 1 year for annual exam or sooner PRN  Marny Lowenstein, PA-C 03/15/2015 3:00 PM

## 2015-03-15 NOTE — Patient Instructions (Signed)
Uso de los anticonceptivos orales (Oral Contraception Use) Los anticonceptivos orales (ACO) son medicamentos que se utilizan para evitar el embarazo. Su funcin es evitar que los ovarios liberen vulos. Las hormonas de los ACO tambin hacen que el moco cervical se haga ms espeso, lo que evita que el esperma ingrese al tero. Tambin hacen que la membrana que recubre internamente al tero se vuelva ms fina, lo que no permite que el huevo fertilizado se adhiera a la pared del tero. Los ACO son muy efectivos cuando se toman exactamente como se prescriben. Sin embargo, no previenen contra las enfermedades de transmisin sexual (ETS). La prctica del sexo seguro, como el uso de preservativos, junto con los ACO, ayudan a prevenir ese tipo de enfermedades. Antes de tomar ACO, debe hacerse un examen fsico y un Papanicolau. El mdico podr indicarle anlisis de sangre, si es necesario. El mdico se asegurar de que usted sea una buena candidata para usar anticonceptivos orales. Converse con su mdico acerca de los posibles efectos secundarios de los ACO que podran recetarle. Cuando se inicia el uso de ACO, se pueden tomar durante 2 a 3 meses para que el cuerpo se adapte a los cambios en los niveles hormonales en el cuerpo.  CMO TOMAR LOS ANTICONCEPTIVOS ORALES El mdico le indicar como comenzar a tomar el primer ciclo de ACO. De lo contrario usted puede:   Comenzar el da de inicio del ciclo menstrual. No necesitar proteccin anticonceptiva adicional al comenzar en este momento.   Comenzar el primer domingo luego de su perodo menstrual, o el da en que adquiere el medicamento. En estos casos deber tener proteccin anticonceptiva adicional durante los primeros 7 das del ciclo.   Comenzar a tomarlos en cualquier momento del ciclo. Si toma el anticonceptivo dentro de los 5 das de iniciado el perodo, estar protegida de quedar embarazada inmediatamente. En este caso, no necesitar una forma adicional de  anticonceptivos. Si comienza en cualquier otro momento del ciclo menstrual, necesitar usar otra forma de anticonceptivo durante 7 das. Si sus ACO son del tipo de los llamados minipldoras, podrn impedir el embarazo despus de tomarlas por 2 das (48 horas). Luego de comenzar a tomar los ACO:   Si olvid de tomar 1 pldora, tmela tan pronto como lo recuerde. Tome la siguiente pldora a la hora habitual.   Si dej de tomar 2 o ms pldoras, comunquese con su mdico ya que diferentes pldoras tienen diferentes instrucciones para las dosis que no se han tomado. Si olvida tomar 2 o ms pldoras, utilice un mtodo anticonceptivo adicional hasta que comience su prximo perodo menstrual.   Si utiliza el envase de 28 pldoras que contienen pldoras inactivas y olvida tomar 1 de las ltimas 7 (pldoras sin hormonas), sto no tiene importancia. Simplemente deseche el resto de las pldoras que no contienen hormonas y comience un nuevo envase.  No importa cuando comience a tomar los anticonceptivos, siempre empiece un nuevo envase el mismo da de la semana. Tenga un envase extra de ACO y use un mtodo anticonceptivo adicional para el caso en que se olvide de tomar algunas pldoras o pierda la caja.  INSTRUCCIONES PARA EL CUIDADO EN EL HOGAR   No fume.   Use siempre un condn para protegerse contra las enfermedades de transmisin sexual. Los ACO no protegen contra las enfermedades de transmisin sexual.   Use un almanaque para marcar los das de su perodo menstrual.   Lea la informacin y consejos que vienen con las ACO. Hable con   el profesional si tiene dudas.  SOLICITE ATENCIN MDICA SI:   Presenta nuseas o vmitos.   Tiene flujo o sangrado vaginal anormal.   Aparece una erupcin cutnea.   No tiene el perodo menstrual.   Pierde el cabello.   Necesita tratamiento por cambios en su estado de nimo o por depresin.   Se siente mareada al tomar los ACO.   Comienza a  aparecer acn con el uso de los ACO.   Queda embarazada.  SOLICITE ATENCIN MDICA DE INMEDIATO SI:   Siente dolor en el pecho.   Le falta el aire.   Le duele mucho la cabeza y no puede controlar el dolor.   Siente adormecimiento o tiene dificultad para hablar.   Tiene problemas de visin.   Presenta dolor, inflamacin o hinchazn en las piernas.  Document Released: 11/02/2011 Document Revised: 07/16/2013 [employer] Patient Information 2015 [employer]. This information is not intended to replace advice given to you by your health care provider. Make sure you discuss any questions you have with your health care provider.  

## 2015-03-16 LAB — HCG, QUANTITATIVE, PREGNANCY

## 2015-03-17 ENCOUNTER — Telehealth: Payer: Self-pay | Admitting: *Deleted

## 2015-03-17 NOTE — Telephone Encounter (Signed)
Contacted patient with Spanish Interpre[facility] [name], results given and to begin birth control.  Pt verbalizes understanding and has no further questions.

## 2015-03-17 NOTE — Telephone Encounter (Signed)
-----   Message from Julie N Wenzel, Marny LowensteinPA-C sent at 03/16/2015  8:06 PM EDT ----- Please call patient and inform her that hCG is negative so she can start her Sprintec OCPs now.   Thanks,  Raynelle FanningJulie

## 2015-11-28 NOTE — L&D Delivery Note (Signed)
35 y.o. [id number] at [id number] delivered a viable female infant in cephalic, LOA position, precipitously in MAU. No nuchal cord. Right anterior shoulder delivered with ease. Moderate mec with particulate fluid. 60 sec delayed cord clamping. Cord clamped x2 and cut. Placenta delivered spontaneously intact, with 3VC. Fundus firm on exam with massage and 10 units IM pitocin. Good hemostasis noted.  Laceration: 1st degree Suture: 3-0 Vicryl Good hemostasis noted.  Mom and baby recovering in LDR.    Apgars: 7/8 Weight: Pending (skin to skin)    Jen MowElizabeth Mumaw, DO OB Fellow [facility] 07/18/2016, 2:46 AM

## 2016-01-21 ENCOUNTER — Inpatient Hospital Stay (HOSPITAL_COMMUNITY)
Admission: AD | Admit: 2016-01-21 | Discharge: 2016-01-21 | Disposition: A | Discharge status: Home / Self Care | Payer: Self-pay | Source: Ambulatory Visit | Attending: Family Medicine | Admitting: Family Medicine

## 2016-01-21 ENCOUNTER — Encounter (HOSPITAL_COMMUNITY): Payer: Self-pay

## 2016-01-21 ENCOUNTER — Inpatient Hospital Stay (HOSPITAL_COMMUNITY): Payer: Self-pay

## 2016-01-21 DIAGNOSIS — Z3491 Encounter for supervision of normal pregnancy, unspecified, first trimester: Secondary | ICD-10-CM

## 2016-01-21 DIAGNOSIS — O26891 Other specified pregnancy related conditions, first trimester: Secondary | ICD-10-CM | POA: Insufficient documentation

## 2016-01-21 DIAGNOSIS — O26899 Other specified pregnancy related conditions, unspecified trimester: Secondary | ICD-10-CM

## 2016-01-21 DIAGNOSIS — O43891 Other placental disorders, first trimester: Secondary | ICD-10-CM

## 2016-01-21 DIAGNOSIS — R109 Unspecified abdominal pain: Secondary | ICD-10-CM | POA: Insufficient documentation

## 2016-01-21 DIAGNOSIS — Z3A11 11 weeks gestation of pregnancy: Secondary | ICD-10-CM | POA: Insufficient documentation

## 2016-01-21 DIAGNOSIS — O4691 Antepartum hemorrhage, unspecified, first trimester: Secondary | ICD-10-CM

## 2016-01-21 DIAGNOSIS — O209 Hemorrhage in early pregnancy, unspecified: Secondary | ICD-10-CM | POA: Insufficient documentation

## 2016-01-21 LAB — URINALYSIS, ROUTINE W REFLEX MICROSCOPIC
Bilirubin Urine: NEGATIVE
Glucose, UA: NEGATIVE mg/dL
KETONES UR: NEGATIVE mg/dL
LEUKOCYTES UA: NEGATIVE
NITRITE: NEGATIVE
Protein, ur: NEGATIVE mg/dL
Specific Gravity, Urine: <1.005 — ABNORMAL LOW (ref 1.005–1.030)
pH: 5 (ref 5.0–8.0)

## 2016-01-21 LAB — URINE MICROSCOPIC-ADD ON

## 2016-01-21 LAB — CBC
HCT: 33.5 % — ABNORMAL LOW (ref 36.0–46.0)
Hemoglobin: 11.4 g/dL — ABNORMAL LOW (ref 12.0–15.0)
MCH: 28.3 pg (ref 26.0–34.0)
MCHC: 34 g/dL (ref 30.0–36.0)
MCV: 83.1 fL (ref 78.0–100.0)
PLATELETS: 227 10*3/uL (ref 150–400)
RBC: 4.03 MIL/uL (ref 3.87–5.11)
RDW: 14.2 % (ref 11.5–15.5)
WBC: 10.2 10*3/uL (ref 4.0–10.5)

## 2016-01-21 LAB — WET PREP, GENITAL
Clue Cells Wet Prep HPF POC: NONE SEEN
Sperm: NONE SEEN
TRICH WET PREP: NONE SEEN
YEAST WET PREP: NONE SEEN

## 2016-01-21 LAB — POCT PREGNANCY, URINE: Preg Test, Ur: POSITIVE — AB

## 2016-01-21 LAB — ABO/RH: ABO/RH(D): B POS

## 2016-01-21 LAB — HCG, QUANTITATIVE, PREGNANCY: hCG, Beta Chain, Quant, S: 47419 m[IU]/mL — ABNORMAL HIGH (ref <5)

## 2016-01-21 NOTE — MAU Provider Note (Signed)
History     CSN: [id number]  Arrival date and time: 01/21/16 1803   First Provider Initiated Contact with Patient 01/21/16 1852       Chief Complaint  Patient presents with  . Back Pain  . Vaginal Bleeding  . Abdominal Cramping    HPI Comments: Michelle Rice is a 35 y.o. [id number] who presents for back pain, leg pain, abdominal cramping, & vaginal bleeding. LMP 10/23/15. [facility] but has not had care yet. Positive pregnancy test at home. States came tonight because her symptoms are the same symptoms she had when she had a miscarriage last year.  Reports red spotting on toilet paper since yesterday. Also has some mild lower abdominal cramping that she doesn't rate on pain scale. Last intercourse was over a week ago.   Back Pain This is a new problem. The current episode started yesterday. The problem occurs constantly. The problem is unchanged. The pain is present in the sacro-iliac and lumbar spine. Quality: sharp. Radiates to: bilateral legs down to feet. The pain is at a severity of 7/10. The symptoms are aggravated by standing. Associated symptoms include abdominal pain (mild abdominal cramping) and leg pain. Pertinent negatives include no bladder incontinence, bowel incontinence, dysuria, numbness, paresis, paresthesias, perianal numbness, tingling or weakness. Risk factors include pregnancy, obesity and lack of exercise. She has tried nothing for the symptoms.    OB History    Gravida Para Term Preterm AB TAB SAB Ectopic Multiple Living   0 1 0 0 3      Past Medical History  Diagnosis Date  . Medical history non-contributory   . Preterm labor     Past Surgical History  Procedure Laterality Date  . Cesarean section      Family History  Problem Relation Age of Onset  . Hypertension Father     Social History  Substance Use Topics  . Smoking status: Never Smoker   . Smokeless tobacco: Never Used  . Alcohol  Use: No    Allergies: No Known Allergies  Prescriptions prior to admission  Medication Sig Dispense Refill Last Dose  . Prenatal Vit-Fe Fumarate-FA (PRENATAL VITAMIN PO) Take 1 tablet by mouth daily.    01/20/2016 at Unknown time    Review of Systems  Constitutional: Negative.   Gastrointestinal: Positive for abdominal pain (mild abdominal cramping). Negative for nausea, vomiting, diarrhea, constipation, blood in stool and bowel incontinence.  Genitourinary: Negative for bladder incontinence, dysuria and hematuria.       + vaginal bleeding  Musculoskeletal: Positive for back pain. Negative for myalgias, joint pain and falls.  Neurological: Negative for tingling, weakness, numbness and paresthesias.   Physical Exam   Blood pressure 120/69, pulse 82, temperature 99 F (37.2 C), temperature source Oral, resp. rate 18, height  (1.575 m), weight 180 lb (81.647 kg), last menstrual period 10/23/2015, unknown if currently breastfeeding.  Physical Exam  Nursing note and vitals reviewed. Constitutional: She is oriented to person, place, and time. She appears well-developed and well-nourished. No distress.  HENT:  Head: Normocephalic and atraumatic.  Eyes: Conjunctivae are normal. Right eye exhibits no discharge. Left eye exhibits no discharge. No scleral icterus.  Neck: Normal range of motion.  Cardiovascular: Normal rate, regular rhythm, normal heart sounds and intact distal pulses.   No murmur heard. Respiratory: Effort normal and breath sounds normal. No respiratory distress. She has no wheezes.  GI: Soft. Bowel sounds are normal. She exhibits  no distension. There is no tenderness.  Genitourinary: Cervix exhibits no motion tenderness and no friability. There is bleeding (small amount of dark red blood cleared out with 1 fox swab) in the vagina.  Cervix closed  Musculoskeletal: Normal range of motion. She exhibits no edema or tenderness.  Neurological: She is alert and oriented to  person, place, and time. She has normal reflexes.  Skin: Skin is warm and dry. She is not diaphoretic.  Psychiatric: She has a normal mood and affect. Her behavior is normal. Judgment and thought content normal.    MAU Course  Procedures Results for orders placed or performed during the hospital encounter of 01/21/16 (from the past 24 hour(s))  Urinalysis, Routine w reflex microscopic (not at [facility])     Status: Abnormal   Collection Time: 01/21/16  6:10 PM  Result Value Ref Range   Color, Urine YELLOW YELLOW   APPearance CLEAR CLEAR   Specific Gravity, Urine <1.005 (L) 1.005 - 1.030   pH 5.0 5.0 - 8.0   Glucose, UA NEGATIVE NEGATIVE mg/dL   Hgb urine dipstick SMALL (A) NEGATIVE   Bilirubin Urine NEGATIVE NEGATIVE   Ketones, ur NEGATIVE NEGATIVE mg/dL   Protein, ur NEGATIVE NEGATIVE mg/dL   Nitrite NEGATIVE NEGATIVE   Leukocytes, UA NEGATIVE NEGATIVE  Urine microscopic-add on     Status: Abnormal   Collection Time: 01/21/16  6:10 PM  Result Value Ref Range   Squamous Epithelial / LPF 0-5 (A) NONE SEEN   WBC, UA 0-5 0 - 5 WBC/hpf   RBC / HPF 0-5 0 - 5 RBC/hpf   Bacteria, UA RARE (A) NONE SEEN  Pregnancy, urine POC     Status: Abnormal   Collection Time: 01/21/16  6:37 PM  Result Value Ref Range   Preg Test, Ur POSITIVE (A) NEGATIVE  CBC     Status: Abnormal   Collection Time: 01/21/16  7:17 PM  Result Value Ref Range   WBC 10.2 4.0 - 10.5 K/uL   RBC 4.03 3.87 - 5.11 MIL/uL   Hemoglobin 11.4 (L) 12.0 - 15.0 g/dL   HCT 40.9 (L) 81.1 - 91.4 %   MCV 83.1 78.0 - 100.0 fL   MCH 28.3 26.0 - 34.0 pg   MCHC 34.0 30.0 - 36.0 g/dL   RDW 78.2 95.6 - 21.3 %   Platelets 227 150 - 400 K/uL  ABO/Rh     Status: None   Collection Time: 01/21/16  7:17 PM  Result Value Ref Range   ABO/RH(D) B POS   hCG, quantitative, pregnancy     Status: Abnormal   Collection Time: 01/21/16  7:17 PM  Result Value Ref Range   hCG, Beta Chain, Quant, S 47419 (H) <5 mIU/mL  Wet prep, genital     Status:  Abnormal   Collection Time: 01/21/16  7:36 PM  Result Value Ref Range   Yeast Wet Prep HPF POC NONE SEEN NONE SEEN   Trich, Wet Prep NONE SEEN NONE SEEN   Clue Cells Wet Prep HPF POC NONE SEEN NONE SEEN   WBC, Wet Prep HPF POC MODERATE (A) NONE SEEN   Sperm NONE SEEN    US Ob Comp Less 14 Wks  01/21/2016  CLINICAL DATA:  Vaginal bleeding EXAM: OBSTETRIC <14 WK ULTRASOUND TECHNIQUE: Transabdominal ultrasound was performed for evaluation of the gestation as well as the maternal uterus and adnexal regions. COMPARISON:  None. FINDINGS: Intrauterine gestational sac: Visualized/normal in shape. Yolk sac:  Not present Embryo:  Present Cardiac  Activity: Present Heart Rate: 155 bpm CRL:   45.4  mm   11 w 2 d                  [place] EDC: 08/09/2016 Subchorionic hemorrhage: Small area of decreased echogenicity is noted adjacent to the gestational sac consistent with a subchorionic hemorrhage. This measures approximately 2.3 by 0.7 cm. Maternal uterus/adnexae: Within normal limits IMPRESSION: Single live intrauterine gestation at 11 weeks 2 days. Small subchorionic hemorrhage is noted. Electronically Signed   By: Alcide Clever M.D.   On: 01/21/2016 20:37    MDM UPT positive B positive SIUP with cardiac activity & small SCH per ultrasound Assessment and Plan  A:  1. Normal IUP (intrauterine pregnancy) on prenatal ultrasound, first trimester   2. Abdominal pain in pregnancy   3. Vaginal bleeding in pregnancy, first trimester   4. Subchorionic hematoma, first trimester     P: Discharge home Pelvic rest Take tylenol PRN back pain Start prenatal care Discussed reasons to return to MAU  Judeth Horn 01/21/2016, 6:52 PM

## 2016-01-21 NOTE — Discharge Instructions (Signed)
Reposo plvico  (Pelvic Rest) El reposo plvico se recomienda a las mujeres cuando:   La placenta cubre parcial o completamente la abertura del cuello del tero (placenta previa).  Hay sangrado entre la pared del tero y el saco amnitico en el primer trimestre (hemorragia subcorinica).  El cuello uterino comienza a abrirse sin iniciarse el trabajo de parto (cuello uterino incompetente, insuficiencia cervical).  El [place] de parto se inicia muy pronto (parto prematuro). INSTRUCCIONES PARA EL CUIDADO EN EL HOGAR   No tenga relaciones sexuales, estimulacin, ni orgasmos.  No use tampones, no se haga duchas vaginales ni coloque ningn objeto en la vagina.  No levante objetos que pesen ms de 10 libras (4,5 kg).  Evite las actividades extenuantes o tensionar los msculos de la pelvis. SOLICITE ATENCIN MDICA SI:   Tiene un sangrado vaginal durante el embarazo. Considrelo como una posible emergencia.  Siente clicos en la zona baja del estmago (ms fuertes que los clicos menstruales).  Nota flujo vaginal (acuoso, con moco o [place]).  Siente un dolor en la espalda leve y sordo.  Tiene contracciones regulares o endurecimiento del tero. SOLICITE ATENCIN MDICA DE INMEDIATO SI:  Observa sangrado vaginal y tiene placenta previa.    Esta informacin no tiene Theme park manager el consejo del mdico. Asegrese de hacerle al mdico cualquier pregunta que tenga.   Document Released: 08/07/2012 Elsevier Interactive Patient Education 2016 Elsevier Inc. Hematoma subcorinico (Subchorionic Hematoma) Un hematoma subcorinico es una acumulacin de sangre entre la pared externa de la placenta y la pared interna del la matriz (tero). La placenta es el rgano que conecta el feto a la pared del tero. La placenta realiza la funcin de alimentacin, respiracin (oxgeno al feto) y el trabajo de eliminacin de desechos (excrecin) del feto.  Un hematoma subcorinico es la anormalidad ms  frecuente encontrada en una ecografa durante el primer trimestre o principios del segundo trimestre del embarazo. Si ha habido poca o ninguna hemorragia vaginal, generalmente los pequeos hematomas se reducen por su propia cuenta y no afectan al beb ni al [place]ders. La sangre es absorbida gradualmente durante una o [place]. Cuando la hemorragia comienza ms tarde en el embarazo o el hematoma es ms grande o se produce en una paciente de edad avanzada, el resultado puede no ser tan bueno. Los grandes hematomas pueden agrandarse an ms y [place] las posibilidades de aborto espontneo. El hematoma subcorinico tambin aumenta el riesgo de desprendimiento precoz de la placenta del tero, muerte fetal y Sport and exercise psychologist. INSTRUCCIONES PARA EL CUIDADO EN EL HOGAR   Repose en cama si el mdico se lo recomienda. Aunque el reposo en cama no evitar la hemorragia o un aborto espontneo, su mdico puede recomendarlo.  Evite levantar objetos pesados (ms de 10 libras [4,5 kg]), hacer ejercicio, tener relaciones sexuales o realizar duchas vaginales segn se lo indique el profesional.  Lleve un registro de la cantidad y Hydrographic surveyor de remojo (saturacin) de las toallas higinicas que Landscape architect. [place] esta informacin.  No use tampones.  Cumpla con todas las visitas de control, segn le indique su mdico. El profesional podr pedirle que se realice anlisis de seguimiento, pruebas de [place] o [place]. SOLICITE ATENCIN MDICA DE INMEDIATO SI:   Siente calambres intensos en el estmago, en la espalda, en el abdomen o en la pelvis.  Tiene fiebre.  Elimina cogulos o tejidos grandes. Guarde los tejidos para que su mdico los vea.  Si la hemorragia aumenta o siente mareos, debilidad o tiene episodios  de desmayos.   Esta informacin no tiene Theme park manager el consejo del mdico. Asegrese de hacerle al mdico cualquier pregunta que tenga.   Document Released: 03/01/2009 Document Revised:  09/03/2013 Elsevier Interactive Patient Education 2016 [employer].     [employer] medicinas seguras para tomar durante el embarazo  Safe Medications in Pregnancy  Acn:  Benzoyl Peroxide (Perxido de benzolo)  Salicylic Acid (cido saliclico)  Dolor de espalda/Dolor de cabeza:  Tylenol: 2 pastillas de concentracin regular cada 4 horas O 2 pastillas de concentracin fuerte cada 6 horas  Resfriados/Tos/Alergias:  Benadryl (sin alcohol) 25 mg cada 6 horas segn lo necesite Breath Right strips ([place] para respirar correctamente)  Claritin  Cepacol (pastillas de chupar para la garganta)  Chloraseptic (aerosol para la garganta)  Cold-Eeze- hasta tres veces por da  Cough drops (pastillas de chupar para la tos, sin alcohol)  Flonase (con receta mdica solamente)  Guaifenesin  Mucinex  Robitussin DM (simple solamente, sin alcohol)  Saline nasal spray/drops (Aerosol nasal salino/gotas) Sudafed (pseudoephedrine) y  Actifed * utilizar slo despus de 12 semanas de gestacin y si no tiene la presin arterial alta.  Tylenol Vicks  VapoRub  Zinc lozenges (pastillas para la garganta)  Zyrtec  Estreimiento:  Colace  Ducolax (supositorios)  Fleet enema (lavado intestinal rectal)  Glycerin (supositorios)  Metamucil  Milk of magnesia (leche de magnesia)  Miralax  Senokot  Smooth Move (t)  Diarrea:  Kaopectate Imodium A-D  *NO tome Pepto-Bismol  Hemorroides:  Anusol  Anusol HC  Preparation H  Tucks  Indigestin:  Tums  Maalox  Mylanta  Zantac  Pepcid  Insomnia:  Benadryl (sin alcohol)  cada 6 horas segn lo necesite  Tylenol PM  Unisom, no Gelcaps  Calambres en las piernas:  Tums  MagGel Nuseas/Vmitos:  Bonine  Dramamine  Emetrol  Ginger (extracto)  Sea-Bands  Meclizine  Medicina para las nuseas que puede tomar durante el embarazo: Unisom (doxylamine succinate, pastillas de 25 mg) Tome una pastilla al da al [place]. Si los sntomas no estn adecuadamente  controlados, la dosis puede aumentarse hasta una dosis mxima recomendada de [employer] al da (1/2 pastilla por la [place], 1/2 pastilla a media tarde y [place] pastilla al [place]). Pastillas de Vitamina B6 de . Tome [employer] veces al da (hasta 200 mg por da).  Erupciones en la piel:  Productos de Aveeno  Benadryl cream (crema o una dosis de  cada 6 horas segn lo necesite)  Calamine Lotion (locin)  1% cortisone cream (crema de cortisona de 1%)  nfeccin vaginal por hongos (candidiasis):  Gyne-lotrimin 7  Monistat 7   **Si est tomando varias medicinas, por favor revise las etiquetas para Art gallery manager los mismos ingredientes [place]. **Tome la medicina segn lo indicado en la etiqueta. **No tome ms de 400 mg de Tylenol en 24 horas. **No tome medicinas que contengan aspirina o ibuprofeno.

## 2016-01-21 NOTE — MAU Note (Addendum)
Back pain down into both of her legs, onset yesterday morning, leg pain is better with elevation, small amount of vaginal bleeding since yesterday, no recent intercourse.  LMP  10/23/2015

## 2016-01-22 LAB — HIV ANTIBODY (ROUTINE TESTING W REFLEX): HIV SCREEN 4TH GENERATION: NONREACTIVE

## 2016-01-24 LAB — GC/CHLAMYDIA PROBE AMP (~~LOC~~) NOT AT ARMC
CHLAMYDIA, DNA PROBE: NEGATIVE
NEISSERIA GONORRHEA: NEGATIVE

## 2016-02-24 ENCOUNTER — Other Ambulatory Visit (HOSPITAL_COMMUNITY): Payer: Self-pay | Admitting: Nurse Practitioner

## 2016-02-24 DIAGNOSIS — Z3689 Encounter for other specified antenatal screening: Secondary | ICD-10-CM

## 2016-02-24 DIAGNOSIS — Z3A18 18 weeks gestation of pregnancy: Secondary | ICD-10-CM

## 2016-02-24 LAB — OB RESULTS CONSOLE RUBELLA ANTIBODY, IGM: Rubella: IMMUNE

## 2016-02-24 LAB — OB RESULTS CONSOLE GC/CHLAMYDIA
Chlamydia: NEGATIVE
Gonorrhea: NEGATIVE

## 2016-02-24 LAB — OB RESULTS CONSOLE ANTIBODY SCREEN: ANTIBODY SCREEN: NEGATIVE

## 2016-02-24 LAB — OB RESULTS CONSOLE HEPATITIS B SURFACE ANTIGEN: Hepatitis B Surface Ag: NEGATIVE

## 2016-02-24 LAB — OB RESULTS CONSOLE ABO/RH: RH Type: POSITIVE

## 2016-02-24 LAB — OB RESULTS CONSOLE HIV ANTIBODY (ROUTINE TESTING): HIV: NONREACTIVE

## 2016-02-24 LAB — OB RESULTS CONSOLE HGB/HCT, BLOOD
HCT: 36 %
Hemoglobin: 11.8 g/dL

## 2016-02-24 LAB — OB RESULTS CONSOLE PLATELET COUNT: PLATELETS: 223 10*3/uL

## 2016-02-24 LAB — OB RESULTS CONSOLE RPR: RPR: NONREACTIVE

## 2016-03-03 ENCOUNTER — Encounter (HOSPITAL_COMMUNITY): Payer: Self-pay | Admitting: Nurse Practitioner

## 2016-03-04 ENCOUNTER — Encounter: Payer: Self-pay | Admitting: *Deleted

## 2016-03-04 DIAGNOSIS — O09899 Supervision of other high risk pregnancies, unspecified trimester: Secondary | ICD-10-CM

## 2016-03-04 DIAGNOSIS — O09219 Supervision of pregnancy with history of pre-term labor, unspecified trimester: Secondary | ICD-10-CM

## 2016-03-04 DIAGNOSIS — O0990 Supervision of high risk pregnancy, unspecified, unspecified trimester: Secondary | ICD-10-CM | POA: Insufficient documentation

## 2016-03-06 ENCOUNTER — Encounter: Payer: Self-pay | Attending: Family Medicine | Admitting: *Deleted

## 2016-03-06 ENCOUNTER — Ambulatory Visit: Payer: Self-pay | Admitting: *Deleted

## 2016-03-06 VITALS — Ht 62.0 in | Wt 180.2 lb

## 2016-03-06 DIAGNOSIS — Z029 Encounter for administrative examinations, unspecified: Secondary | ICD-10-CM | POA: Insufficient documentation

## 2016-03-06 DIAGNOSIS — O24419 Gestational diabetes mellitus in pregnancy, unspecified control: Secondary | ICD-10-CM

## 2016-03-06 NOTE — Progress Notes (Signed)
  Patient was seen on 03/06/13 for Gestational Diabetes self-management . Strata Interpreter utilized: [employer] [phone] The following learning objectives were met by the patient :   States the definition of Gestational Diabetes  States when to check blood glucose levels  Demonstrates proper blood glucose monitoring techniques  States the effect of stress and exercise on blood glucose levels  States the importance of limiting caffeine and abstaining from alcohol and smoking  Plan:  Consider  increasing your activity level by walking daily as tolerated Begin checking BG before breakfast and 2 hours after first bit of breakfast, lunch and dinner after  as directed by MD  Take medication  as directed by MD  Blood glucose monitor given: [employer] Lot # [id number] Exp: 2018/02/08 Blood glucose reading: 2hpp '101mg'$ /dl  Patient instructed to monitor glucose levels: FBS: 60 - <90 2 hour: <120  Patient received the following handouts:  Nutrition Diabetes and Pregnancy  Carbohydrate Counting List  Meal Planning worksheet  Patient will be seen for follow-up as needed.

## 2016-03-06 NOTE — Progress Notes (Signed)
Nutrition note: GDM diet education Pt was recently diagnosed with GDM. Pt reports eating ~3 meals/d. Pt is taking a PNV. Pt reports no N&V or heartburn. NKFA. Pt reports no walking or physical activity. Pt received verbal & written education on GDM diet in Spanish via an interpreter. Encouraged ~30 mins of walking most days of the week. Pt agrees to follow GDM diet with 3 meals & 1-3 snacks/d with proper CHO/ protein combination. Pt does not have WIC - plans to apply next week. Pt plans to BF. F/u in 2wks (no WIC & Worksheet). Michelle RevealLaura Talen Poser, MS, RD, LDN, [facility]

## 2016-03-08 ENCOUNTER — Encounter: Payer: Self-pay | Admitting: *Deleted

## 2016-03-16 ENCOUNTER — Other Ambulatory Visit (HOSPITAL_COMMUNITY): Payer: Self-pay | Admitting: Nurse Practitioner

## 2016-03-16 ENCOUNTER — Ambulatory Visit (HOSPITAL_COMMUNITY)
Admission: RE | Admit: 2016-03-16 | Discharge: 2016-03-16 | Disposition: A | Discharge status: Home / Self Care | Payer: Self-pay | Source: Ambulatory Visit | Attending: Nurse Practitioner | Admitting: Nurse Practitioner

## 2016-03-16 ENCOUNTER — Encounter (HOSPITAL_COMMUNITY): Payer: Self-pay

## 2016-03-16 DIAGNOSIS — O34219 Maternal care for unspecified type scar from previous cesarean delivery: Secondary | ICD-10-CM

## 2016-03-16 DIAGNOSIS — Z3689 Encounter for other specified antenatal screening: Secondary | ICD-10-CM

## 2016-03-16 DIAGNOSIS — O09892 Supervision of other high risk pregnancies, second trimester: Secondary | ICD-10-CM

## 2016-03-16 DIAGNOSIS — O24419 Gestational diabetes mellitus in pregnancy, unspecified control: Secondary | ICD-10-CM

## 2016-03-16 DIAGNOSIS — Z3A19 19 weeks gestation of pregnancy: Secondary | ICD-10-CM

## 2016-03-16 DIAGNOSIS — O09212 Supervision of pregnancy with history of pre-term labor, second trimester: Secondary | ICD-10-CM

## 2016-03-16 DIAGNOSIS — Z36 Encounter for antenatal screening of mother: Secondary | ICD-10-CM | POA: Insufficient documentation

## 2016-03-16 DIAGNOSIS — O24410 Gestational diabetes mellitus in pregnancy, diet controlled: Secondary | ICD-10-CM | POA: Insufficient documentation

## 2016-03-16 DIAGNOSIS — Z3A18 18 weeks gestation of pregnancy: Secondary | ICD-10-CM

## 2016-03-20 ENCOUNTER — Encounter: Payer: Self-pay | Admitting: Obstetrics and Gynecology

## 2016-03-20 ENCOUNTER — Encounter: Payer: Self-pay | Admitting: *Deleted

## 2016-03-20 ENCOUNTER — Ambulatory Visit (INDEPENDENT_AMBULATORY_CARE_PROVIDER_SITE_OTHER): Payer: Self-pay | Admitting: Obstetrics and Gynecology

## 2016-03-20 VITALS — BP 116/63 | HR 83 | Wt 180.2 lb

## 2016-03-20 DIAGNOSIS — O0992 Supervision of high risk pregnancy, unspecified, second trimester: Secondary | ICD-10-CM

## 2016-03-20 DIAGNOSIS — O09892 Supervision of other high risk pregnancies, second trimester: Secondary | ICD-10-CM

## 2016-03-20 DIAGNOSIS — O09212 Supervision of pregnancy with history of pre-term labor, second trimester: Secondary | ICD-10-CM

## 2016-03-20 DIAGNOSIS — O24419 Gestational diabetes mellitus in pregnancy, unspecified control: Secondary | ICD-10-CM | POA: Insufficient documentation

## 2016-03-20 DIAGNOSIS — O24919 Unspecified diabetes mellitus in pregnancy, unspecified trimester: Secondary | ICD-10-CM

## 2016-03-20 DIAGNOSIS — O34219 Maternal care for unspecified type scar from previous cesarean delivery: Secondary | ICD-10-CM

## 2016-03-20 LAB — POCT URINALYSIS DIP (DEVICE)
BILIRUBIN URINE: NEGATIVE
GLUCOSE, UA: NEGATIVE mg/dL
Hgb urine dipstick: NEGATIVE
KETONES UR: NEGATIVE mg/dL
LEUKOCYTES UA: NEGATIVE
NITRITE: NEGATIVE
PH: 5.5 (ref 5.0–8.0)
Protein, ur: NEGATIVE mg/dL
Specific Gravity, Urine: 1.025 (ref 1.005–1.030)
Urobilinogen, UA: 0.2 mg/dL (ref 0.0–1.0)

## 2016-03-20 MED ORDER — ASPIRIN EC 81 MG PO TBEC: 81.0000 mg | DELAYED_RELEASE_TABLET | Freq: Every day | ORAL | Status: DC

## 2016-03-20 NOTE — Patient Instructions (Signed)
Diabetes mellitus gestacional (Gestational Diabetes Mellitus) La diabetes mellitus gestacional, ms comnmente conocida como diabetes gestacional es un tipo de diabetes que desarrollan algunas mujeres durante el embarazo. En la diabetes gestacional, el pncreas no produce suficiente insulina (una hormona) o las clulas son menos sensibles a la insulina producida (resistencia a la insulina), o ambas cosas. Normalmente, la insulina mueve los azcares de los alimentos a las clulas de los tejidos. Las clulas de los tejidos utilizan los azcares para obtener energa. La falta de insulina o la falta de una respuesta normal a la insulina hace que el exceso de azcar se acumule en la sangre en lugar de penetrar en las clulas de los tejidos. Como resultado, se producen niveles altos de azcar en la sangre (hiperglucemia). El efecto de los niveles altos de azcar (glucosa) puede causar muchos problemas.  FACTORES DE RIESGO Usted tiene mayor probabilidad de desarrollar diabetes gestacional si tiene antecedentes familiares de diabetes y tambin si tiene uno o ms de los siguientes factores de riesgo:  ndice de masa corporal superior a 30 (obesidad).  Embarazo previo con diabetes gestacional.  La edad avanzada en el momento del embarazo. Si se mantienen los niveles de glucosa en la sangre en un rango normal durante el embarazo, las mujeres pueden tener un embarazo saludable. Si los niveles de glucosa en la sangre no estn bien controlados, puede haber riesgos para usted, el feto o el recin nacido, o durante el trabajo de parto y el parto.  SNTOMAS  Si se presentan sntomas, stos son similares a los sntomas que normalmente experimentar durante el embarazo. Los sntomas de la diabetes gestacional son:   Aumento de la sed (polidipsia).  Aumento de la miccin (poliuria).  Orina con ms frecuencia durante la noche (nocturia).  Prdida de peso. La prdida de peso puede ser muy rpida.  Infecciones  frecuentes y recurrentes.  Cansancio (fatiga).  Debilidad.  Cambios en la visin, como visin borrosa.  Olor a fruta en el aliento.  Dolor abdominal. DIAGNSTICO La diabetes se diagnostica cuando hay aumento de los niveles de glucosa en la sangre. El nivel de glucosa en la sangre puede controlarse en uno o ms de los siguientes anlisis de sangre:  Medicin de glucosa en la sangre en ayunas. No se le permitir comer durante al menos 8 horas antes de que se tome una muestra de sangre.  Pruebas al azar de glucosa en la sangre. El nivel de glucosa en la sangre se controla en cualquier momento del da sin importar el momento en que haya comido.  Prueba de tolerancia a la glucosa oral (PTGO). La glucosa en la sangre se mide despus de no haber comido (ayunas) durante una a tres horas y despus de beber una bebida que contenga glucosa. Dado que las hormonas que causan la resistencia a la insulina son ms altas alrededor de las semanas 24 a 28 de embarazo, generalmente se realiza una PTGO durante ese tiempo. Si tiene factores de riesgo, en la primera visita prenatal pueden hacerle pruebas de deteccin de diabetes tipo 2 no diagnosticada. TRATAMIENTO  La diabetes gestacional debe controlarse en primer lugar con dieta y ejercicios. Pueden agregarse medicamentos, pero solo si son necesarios.  Usted tendr que tomar medicamentos para la diabetes o insulina diariamente para mantener los niveles de glucosa en la sangre en el rango deseado.  Usted tendr que combinar la dosis de insulina con la actividad fsica y la eleccin de alimentos saludables. Si tiene diabetes gestacional, el objetivo del tratamiento ser   mantener los siguientes niveles sanguneos de glucosa:  Antes de las comidas (preprandial): valor de 95 mg/dl o inferior.  Despus de las comidas (posprandial):  Una hora despus de la comida: valor de 140 mg/dl o inferior.  Dos horas despus de la comida: valor de 120 mg/dl o  inferior. Si tiene diabetes tipo 1 o tipo 2 preexistente, el objetivo del tratamiento ser mantener los siguientes niveles sanguneos de glucosa:  Antes de las comidas, a la hora de acostarse y durante la noche: de 60 a 99 mg/dl.  Despus de las comidas: valor mximo de 100 a 129 mg/dl. INSTRUCCIONES PARA EL CUIDADO EN EL HOGAR   Controle su nivel de hemoglobina A1c dos veces al ao.  Contrlese a diario el nivel de glucosa en la sangre segn las indicaciones de su mdico. Es comn realizar controles frecuentes de la glucosa en la sangre.  Supervise las cetonas en la orina cuando est enferma y segn las indicaciones de su mdico.  Tome el medicamento para la diabetes y adminstrese insulina segn las indicaciones de su mdico para mantener el nivel de glucosa en la sangre en el rango deseado.  Nunca se quede sin medicamento para la diabetes o sin insulina. Es necesario que la reciba todos los das.  Ajuste la insulina segn la ingesta de hidratos de carbono. Los hidratos de carbono pueden aumentar los niveles de glucosa en la sangre, pero deben incluirse en su dieta. Los hidratos de carbono aportan vitaminas, minerales y fibra que son una parte esencial de una dieta saludable. Los hidratos de carbono se encuentran en frutas, verduras, cereales integrales, productos lcteos, legumbres y alimentos que contienen azcares aadidos.  Consuma alimentos saludables. Alterne 3 comidas con 3 colaciones.  Aumente de peso saludablemente. El aumento del peso total vara de acuerdo con el ndice de masa corporal que tena antes del embarazo (IMC).  Lleve una tarjeta de alerta mdica o use una pulsera o medalla de alerta mdica.  Lleve con usted una colacin de 15gramos de hidratos de carbono en todo momento para controlar los niveles bajos de glucosa en la sangre (hipoglucemia). Algunos ejemplos de colaciones de 15gramos de hidratos de carbono son los siguientes:  Tabletas de glucosa, 3 o 4.  Gel  de glucosa, tubo de 15 gramos.  Pasas de uva, 2 cucharadas (24 g).  Caramelos de goma, 6.  Galletas de animales, 8.  Jugo de fruta, gaseosa comn, o leche descremada, 4 onzas (120 ml).  Pastillas de goma, 9.  Reconocer la hipoglucemia. Durante el embarazo la hipoglucemia se produce cuando hay niveles de glucosa en la sangre de 60 mg/dl o menos. El riesgo de hipoglucemia aumenta durante el ayuno o cuando se saltea las comidas, durante o despus de realizar ejercicio intenso y mientras duerme. Los sntomas de hipoglucemia son:  Temblores o sacudidas.  Disminucin de la capacidad de concentracin.  Sudoracin.  Aumento de la frecuencia cardaca.  Dolor de cabeza.  Sequedad en la boca.  Hambre.  Irritabilidad.  Ansiedad.  Sueo agitado.  Alteracin del habla o de la coordinacin.  Confusin.  Tratar la hipoglucemia rpidamente. Si usted est alerta y puede tragar con seguridad, siga la regla de 15/15 que consiste en:  Tome entre 15 y 20gramos de glucosa de accin rpida o carbohidratos. Las opciones de accin rpida son un gel de glucosa, tabletas de glucosa, o 4 onzas (120 ml) de jugo de frutas, gaseosa comn, o leche baja en grasa.  Compruebe su nivel de glucosa en la sangre   15 minutos despus de tomar la glucosa.  Tome entre 15 y 20 gramos ms de glucosa si el nivel de glucosa en la sangre todava es de 70mg/dl o inferior.  Ingiera una comida o una colacin en el lapso de 1 hora una vez que los niveles de glucosa en la sangre vuelven a la normalidad.  Est atento a la poliuria (miccin excesiva) y la polidipsia (sensacin de mucha sed), que son los primeros signos de la hiperglucemia. El reconocimiento temprano de la hiperglucemia permite un tratamiento oportuno. Trate la hiperglucemia segn le indic su mdico.  Haga actividad fsica por lo menos 30minutos al da o como lo indique su mdico. Se recomienda que 30 minutos despus de cada comida, realice diez minutos  de actividad fsica para controlar los niveles de glucosa postprandial en la sangre.  Ajuste su dosis de insulina y la ingesta de alimentos, segn sea necesario, si inicia un nuevo ejercicio o deporte.  Siga su plan para los das de enfermedad cuando no pueda comer o beber como de costumbre.  Evite el tabaco y el alcohol.  Concurra a todas las visitas de control como se lo haya indicado el mdico.  Siga el consejo del mdico respecto a los controles prenatales y posteriores al parto (postparto), las visitas, la planificacin de las comidas, el ejercicio, los medicamentos, las vitaminas, los anlisis de sangre, otras pruebas mdicas y actividades fsicas.  Realice diariamente el cuidado de la piel y de los pies. Examine su piel y los pies diariamente para ver si tiene cortes, moretones, enrojecimiento, problemas en las uas, sangrado, ampollas o llagas.  Cepllese los dientes y encas por lo menos dos veces al da y use hilo dental al menos una vez por da. Concurra regularmente a las visitas de control con el dentista.  Programe un examen de vista durante el primer trimestre de su embarazo o como lo indique su mdico.  Comparta su plan de control de diabetes en el trabajo o en la escuela.  Mantngase al da con las vacunas.  Aprenda a manejar el estrs.  Obtenga la mayor cantidad posible de informacin sobre la diabetes y solicite ayuda siempre que sea necesario.  Obtenga informacin sobre el amamantamiento y analice esta posibilidad.  Debe controlar el nivel de azcar en la sangre de 6a 12semanas despus del parto. Esto se hace con una prueba de tolerancia a la glucosa oral (PTGO). SOLICITE ATENCIN MDICA SI:   No puede comer alimentos o beber por ms de 6 horas.  Tuvo nuseas o ha vomitado durante ms de 6 horas.  Tiene un nivel de glucosa en la sangre de 200 mg/dl y cetonas en la orina.  Presenta algn cambio en el estado mental.  Desarrolla problemas de visin.  Sufre  un dolor persistente de cabeza.  Siente dolor o molestias en la parte superior del abdomen.  Desarrolla una enfermedad grave adicional.  Tuvo diarrea durante ms de 6 horas.  Ha estado enfermo o ha tenido fiebre durante un par de das y no mejora. SOLICITE ATENCIN MDICA DE INMEDIATO SI:   Tiene dificultad para respirar.  Ya no siente los movimientos del beb.  Est sangrando o tiene flujo vaginal.  Comienza a tener contracciones o trabajo de parto prematuro. ASEGRESE DE QUE:  Comprende estas instrucciones.  Controlar su afeccin.  Recibir ayuda de inmediato si no mejora o si empeora.   Esta informacin no tiene como fin reemplazar el consejo del mdico. Asegrese de hacerle al mdico cualquier pregunta que tenga.     Document Released: 08/23/2005 Document Revised: 12/04/2014 Elsevier Interactive Patient Education 2016 [employer].  [place]­ trimestre de Psychiatrist (Second Trimester of Pregnancy) El segundo trimestre va desde la semana13 hasta la 28, desde el cuarto hasta el sexto mes, y suele ser el momento en el que mejor se siente. Su organismo se ha adaptado a Charity fundraiser y comienza a Diplomatic Services operational officer. En general, las nuseas matutinas han disminuido o han desaparecido completamente, puede tener ms energa y un aumento de apetito. El segundo trimestre es tambin la poca en la que el feto se desarrolla rpidamente. Hacia el final del sexto mes, el feto mide aproximadamente 9pulgadas (23cm) y pesa alrededor de 1 libras (700g). Es probable que sienta que el beb se Teacher, English as a foreign language (da pataditas) entre las 18 y 20semanas del Psychiatrist. CAMBIOS EN EL ORGANISMO Su organismo atraviesa por muchos cambios durante el [place], y estos varan de Neomia Dear mujer a Educational psychologist.   Seguir [employer]. Notar que la parte baja del abdomen sobresale.  Podrn aparecer las primeras [employer] caderas, el abdomen y las [place].  Es posible que tenga dolores de cabeza que pueden  aliviarse con los medicamentos que el mdico le permita tomar.  Tal vez tenga necesidad de orinar con ms frecuencia porque el feto est ejerciendo presin Ambulance person.  Debido al Vanetta Mulders podr sentir Anthoney Harada estomacal con frecuencia.  Puede estar estreida, ya que ciertas hormonas enlentecen los movimientos de los msculos que [employer] desechos a travs de los intestinos.  Pueden aparecer hemorroides o abultarse e hincharse las venas (venas varicosas).  Puede tener dolor de espalda que se debe al [employer] de peso y a que las hormonas del Management consultant las articulaciones entre los huesos de la pelvis, y Public librarian consecuencia de la modificacin del peso y los msculos que mantienen el equilibrio.  Las [employer] seguirn creciendo y Development worker, community.  Las Veterinary surgeon y estar sensibles al cepillado y al hilo dental.  Pueden aparecer zonas oscuras o manchas (cloasma, mscara del Psychiatrist) en el rostro que probablemente se atenuar despus del nacimiento del beb.  Es posible que se forme una lnea oscura desde el ombligo hasta la zona del pubis (linea nigra) que probablemente se atenuar despus del nacimiento del beb.  Tal vez haya cambios en el cabello que pueden incluir su engrosamiento, crecimiento rpido y cambios en la textura. Adems, a algunas mujeres se les cae el cabello durante o despus del embarazo, o tienen el cabello seco o fino. Lo ms probable es que el cabello se le normalice despus del nacimiento del beb. QU DEBE ESPERAR EN LAS CONSULTAS PRENATALES Durante una visita prenatal de rutina:  La pesarn para asegurarse de que usted y el feto estn creciendo normalmente.  Le tomarn la presin arterial.  Le medirn el abdomen para controlar el desarrollo del beb.  Se escucharn los latidos cardacos fetales.  Se evaluarn los resultados de los estudios solicitados en visitas anteriores. El mdico puede preguntarle lo siguiente:  Cmo se siente.  Si siente los  movimientos del beb.  Si ha tenido sntomas anormales, como prdida de lquido, [place], dolores de cabeza intensos o clicos abdominales.  Si est consumiendo algn producto que contenga tabaco, como cigarrillos, tabaco de Theatre manager y Administrator, Civil Service.  Si tiene [employer]. Otros estudios que podrn realizarse durante el segundo trimestre incluyen lo siguiente:  Anlisis de sangre para detectar lo siguiente:  Concentraciones de hierro bajas (anemia).  Diabetes gestacional (entre la semana 24 y la 28).  Anticuerpos Rh.  Anlisis de orina para detectar infecciones, diabetes o protenas en la orina.  Una ecografa para confirmar que el beb crece y se desarrolla correctamente.  Una amniocentesis para diagnosticar posibles problemas genticos.  Estudios del feto para descartar espina bfida y sndrome de [place].  Prueba del VIH (virus de inmunodeficiencia humana). Los exmenes prenatales de rutina incluyen la prueba de deteccin del VIH, a menos que decida no Futures trader. INSTRUCCIONES PARA EL CUIDADO EN EL HOGAR   Evite fumar, consumir hierbas, beber alcohol y tomar frmacos que no le hayan recetado. Estas sustancias qumicas afectan la formacin y el desarrollo del beb.  No consuma ningn producto que contenga tabaco, lo que incluye cigarrillos, tabaco de Theatre manager y Administrator, Civil Service. Si necesita ayuda para dejar de fumar, consulte al [employer]. Puede recibir asesoramiento y otro tipo de recursos para dejar de fumar.  Siga las indicaciones del mdico en relacin con el uso de medicamentos. Durante el embarazo, hay medicamentos que son seguros de tomar y otros que no.  Haga ejercicio solamente como se lo haya indicado el mdico. Sentir clicos uterinos es un buen signo para Restaurant manager, fast food actividad fsica.  Contine comiendo alimentos sanos con regularidad.  Use un sostn que le brinde buen soporte si le [employer].  No se d baos de inmersin en agua caliente,  baos turcos ni saunas.  Use el cinturn de seguridad en todo momento mientras conduce.  No coma carne cruda ni queso sin cocinar; evite el contacto con las bandejas sanitarias de los gatos y la tierra que estos animales usan. Estos elementos contienen grmenes que pueden causar defectos congnitos en el beb.  Tome las vitaminas prenatales.  Tome entre 1500 y  de calcio diariamente comenzando en la semana20 del embarazo [place].  Si est estreida, pruebe un laxante suave (si el mdico lo autoriza). Consuma ms alimentos ricos en fibra, como vegetales y frutas frescos y Radiation protection practitioner. Beba gran cantidad de lquido para mantener la orina de tono claro o color amarillo plido.  Dese baos de asiento con agua tibia para Engineer, materials o las molestias causadas por las hemorroides. Use una crema para las hemorroides si el mdico la autoriza.  Si tiene venas varicosas, use medias de descanso. Eleve los pies durante , 3 o 4veces por da. Limite el consumo de sal en su dieta.  No levante objetos pesados, use zapatos de tacones bajos y [address].  Descanse con las piernas elevadas si tiene calambres o dolor de cintura.  Visite a su dentista si an no lo ha Occupational hygienist. Use un cepillo de dientes blando para higienizarse los dientes y psese el hilo dental con suavidad.  Puede seguir [employer], a menos que el mdico le indique lo contrario.  Concurra a todas las visitas prenatales segn las indicaciones de su mdico. SOLICITE ATENCIN MDICA SI:   [place].  Siente clicos leves, presin en la pelvis o dolor persistente en el abdomen.  Tiene nuseas, vmitos o diarrea persistentes.  Brett Fairy secrecin vaginal con mal olor.  Siente dolor al [employer]. SOLICITE ATENCIN MDICA DE INMEDIATO SI:   Tiene fiebre.  Tiene una prdida de lquido por la vagina.  Tiene sangrado o pequeas prdidas  vaginales.  Siente dolor intenso o clicos en el abdomen.  Sube o baja de peso rpidamente.  Tiene dificultad para respirar y siente dolor de pecho.  Sbitamente se le [employer], las [place], [place],  los pies o las piernas.  No ha sentido los movimientos del beb durante Georgianne Fick.  Siente un dolor de cabeza intenso que no se alivia con medicamentos.  Su visin se modifica.   Esta informacin no tiene Theme park manager el consejo del mdico. Asegrese de hacerle al mdico cualquier pregunta que tenga.   Document Released: 08/23/2005 Document Revised: 12/04/2014 Elsevier Interactive Patient Education 2016 [employer].  [employer] del mtodo anticonceptivo (Contraception Choices) La anticoncepcin (control de la natalidad) es el uso de cualquier mtodo o dispositivo para Location manager. A continuacin se indican algunos de esos mtodos. MTODOS HORMONALES   El Implante contraconceptivo consiste en un tubo plstico delgado que contiene la hormona progesterona. No contiene estrgenos. El mdico inserta el tubo en la parte interna del brazo. El tubo puede Geneticist, molecular durante 3 aos. Despus de los 3 aos debe retirarse. El implante impide que los ovarios liberen vulos (ovulacin), espesa el moco cervical, lo que evita que los espermatozoides ingresen al tero y hace ms delgada la membrana que cubre el interior del tero.  Inyecciones de progesterona sola: las Insurance underwriter cada 3 meses para Location manager. La progesterona sinttica impide que los ovarios liberen vulos. Tambin hacen que el moco cervical se espese y modifique el tejido de recubrimiento interno del tero. Esto hace ms difcil que los espermatozoides sobrevivan en el tero.  Las pldoras anticonceptivas contienen estrgenos y Education officer, museum. Su funcin es [employer] ovarios liberen vulos (ovulacin). Las hormonas de los anticonceptivos orales hacen que el moco cervical se haga  ms espeso, lo que evita que el esperma ingrese al tero. Las pldoras anticonceptivas son recetadas por el mdico.Tambin se utilizan para tratar los perodos menstruales abundantes.  Minipldora: este tipo de pldora anticonceptiva contiene slo hormona progesterona. Deben tomarse todos los [address] del mes y debe recetarlas el mdico.  El parche de control de natalidad: contiene hormonas similares a las que contienen las pldoras anticonceptivas. Deben cambiarse una vez por semana y se utilizan bajo prescripcin mdica.  Anillo vaginal: contiene hormonas similares a las que contienen las pldoras anticonceptivas. Se deja colocado durante tres semanas, se lo retira durante 1 semana y luego se coloca uno nuevo. La paciente debe sentirse cmoda al insertar y retirar el anillo de la vagina.Es necesaria la prescripcin mdica.  Anticonceptivos de emergencia: son mtodos para evitar un embarazo despus de Neomia Dear relacin sexual sin proteccin. Esta pldora puede tomarse inmediatamente despus de Child psychotherapist sexuales o hasta 5 [place] de haber tenido sexo sin proteccin. Es ms efectiva si se toma poco tiempo despus de la relacin sexual. Los anticonceptivos de emergencia estn disponibles sin prescripcin mdica. Consltelo con su farmacutico. No use los anticonceptivos de emergencia como nico mtodo anticonceptivo. MTODOS DE Lenis Noon   Condn masculino: es una vaina delgada (ltex o goma) que se coloca cubriendo al pene durante el acto sexual. Deri Fuelling con espermicida para aumentar la efectividad.  Condn femenino. Es una funda delicada y blanda que se adapta holgadamente a la vagina antes de las Clinical research associate.  Diafragma: es una barrera de ltex redonda y suave que debe ser recomendado por un profesional. Se inserta en la vagina, junto con un gel espermicida. Debe insertarse antes de Management consultant. Debe dejar el diafragma colocado en la vagina durante 6 a 8 horas despus de la  relacin sexual.  Capuchn cervical: es una barrera de ltex o taza plstica redonda y [place] que cubre el cuello del tero y  debe ser colocada por un mdico. Puede dejarlo colocado en la vagina hasta 48 horas despus de las [employer].  Esponja: es una pieza blanda y circular de espuma de poliuretano. Contiene un espermicida. Se inserta en la vagina despus de mojarla y antes de las [employer].  Espermicidas: son sustancias qumicas que matan o bloquean al esperma y no lo dejan ingresar al cuello del tero y al tero. Vienen en forma de cremas, geles, supositorios, espuma o comprimidos. No es necesario tener Emergency planning/management officer. Se insertan en la vagina con un aplicador antes de Management consultant. El proceso debe repetirse cada vez que tiene relaciones sexuales. ANTICONCEPTIVOS INTRAUTERINOS  Dispositivo intrauterino (DIU) es un dispositivo en forma de T que se coloca en el tero durante el perodo menstrual, para Location manager. Hay dos tipos:  DIU de cobre: este tipo de DIU est recubierto con un alambre de cobre y se inserta dentro del tero. El cobre hace que el tero y las trompas de Falopio produzcan un liquido que [employer] espermatozoides. Puede permanecer colocado durante 10 aos.  DIU con hormona: este tipo de DIU contiene la hormona progestina (progesterona sinttica). La hormona espesa el moco cervical y evita que los espermatozoides ingresen al tero y tambin afina la membrana que cubre el tero para evitar la implantacin del vulo fertilizado. La hormona debilita o destruye los espermatozoides que ingresan al tero. Puede Geneticist, molecular durante 3-5 aos, segn el tipo de DIU que se [place]. MTODOS ANTICONCEPTIVOS PERMANENTES  Ligadura de trompas en la mujer: se realiza sellando, atando u obstruyendo quirrgicamente las trompas de Falopio lo que impide que el vulo descienda hacia el tero.  Esterilizacin histeroscpica: Implica la colocacin de  un pequeo espiral o la insercin en cada trompa de Falopio. El mdico utiliza una tcnica llamada histeroscopa para Primary school teacher procedimiento. El dispositivo produce la formacin de tejido Designer, television/film set. Esto da como resultado una obstruccin permanente de las trompas de Falopio, de modo que la esperma no pueda fertilizar el vulo. Demora alrededor de 3 meses despus del procedimiento hasta que el conducto se obstruye. Tendr que usar otro mtodo anticonceptivo durante al menos 3 meses.  Esterilizacin masculina: se realiza ligando los conductos por los que pasan los espermatozoides (vasectoma).Esto impide que el esperma ingrese a la vagina durante el acto sexual. Luego del procedimiento, el hombre puede eyacular lquido (semen). MTODOS DE PLANIFICACIN NATURAL  Planificacin familiar natural: consiste en no Management consultant o usar un mtodo de barrera (condn, Center, capuchn cervical) en los [employer] la mujer podra quedar [place].  Mtodo de calendario: consiste en el seguimiento de la duracin de cada ciclo menstrual y la identificacin de los perodos frtiles.  Mtodo de ovulacin: Paramedic las relaciones sexuales durante la ovulacin.  Mtodo sintotrmico: Advertising copywriter sexuales en la poca en la que se est ovulando, utilizando un termmetro y tendiendo en cuenta los sntomas de la ovulacin.  Mtodo postovulacin: Youth worker las relaciones sexuales para despus de haber ovulado. Independientemente del tipo o mtodo anticonceptivo que usted elija, es importante que use condones para protegerse contra las infecciones de transmisin sexual (ETS). Hable con su mdico con respecto a qu mtodo anticonceptivo es el ms apropiado para usted.   Esta informacin no tiene Theme park manager el consejo del mdico. Asegrese de hacerle al mdico cualquier pregunta que tenga.   Document Released: 11/13/2005 Document Revised:  07/16/2013 Elsevier Interactive Patient Education 2016 [employer].  [place]  materna (Breastfeeding) Decidir Museum/gallery exhibitions officer es una de las mejores elecciones que puede hacer por usted y su beb. El cambio hormonal durante el Psychiatrist produce el desarrollo del tejido mamario y [place] la cantidad y el tamao de los conductos galactforos. Estas hormonas tambin permiten que las protenas, los azcares y las grasas de la sangre produzcan la [employer] materna en las glndulas productoras de [place]. Las hormonas impiden que la leche materna sea liberada antes del nacimiento del beb, adems de impulsar el flujo de leche luego del nacimiento. Una vez que ha comenzado a Museum/gallery exhibitions officer, Conservation officer, nature beb, as Immunologist succin o Theatre manager, pueden estimular la liberacin de [place] de las glndulas productoras de [place].  LOS BENEFICIOS DE AMAMANTAR Para el beb  La primera leche (calostro) ayuda a Careers information officer funcionamiento del sistema digestivo del beb.  La leche tiene anticuerpos que ayudan a Radio producer las infecciones en el beb.  El beb tiene una menor incidencia de asma, alergias y del sndrome de muerte sbita del lactante.  Los nutrientes en la [place] materna son mejores para el beb que la [place] maternizada y estn preparados exclusivamente para cubrir las necesidades del beb.  La leche materna mejora el desarrollo cerebral del beb.  Es menos probable que el beb desarrolle otras enfermedades, como obesidad infantil, asma o diabetes mellitus de tipo 2. Para usted   La lactancia materna favorece el desarrollo de un vnculo muy especial entre la madre y el beb.  Es conveniente. La leche materna siempre est disponible a la Human resources officer y es [place].  La lactancia materna ayuda a quemar caloras y a perder el peso ganado durante el [place].  Favorece la contraccin del tero al tamao que tena antes del embarazo de manera ms rpida y disminuye el sangrado (loquios) despus del parto.  La  lactancia materna contribuye a reducir Nurse, adult de desarrollar diabetes mellitus de tipo 2, osteoporosis o cncer de mama o de ovario en el futuro. SIGNOS DE QUE EL BEB EST HAMBRIENTO Primeros signos de [address] de [place].  Se estira.  Mueve la cabeza de un lado a otro.  Mueve la cabeza y abre la boca cuando se le toca la mejilla o la comisura de la boca (reflejo de bsqueda).  Aumenta las vocalizaciones, tales como sonidos de succin, se relame los labios, emite arrullos, suspiros, o chirridos.  Mueve la [employer] boca.  Se chupa con ganas los dedos o las manos. Signos tardos de Fisher Scientific.  Llora de manera intermitente. Signos de [employer] Corporation signos de hambre extrema requerirn que lo calme y lo consuele antes de que el beb pueda alimentarse adecuadamente. No espere a que se manifiesten los siguientes signos de hambre extrema para comenzar a Museum/gallery exhibitions officer:   Designer, jewellery.  Llanto intenso y fuerte.  Gritos. INFORMACIN BSICA SOBRE LA LACTANCIA MATERNA Iniciacin de la lactancia materna  Encuentre un lugar cmodo para sentarse o acostarse, con un buen respaldo para el cuello y la espalda.  Coloque una almohada o una manta enrollada debajo del beb para acomodarlo a la altura de la mama (si est sentada). Las almohadas para Museum/gallery exhibitions officer se han diseado especialmente a fin de servir de apoyo para los brazos y el beb [employer].  Asegrese de que el abdomen del beb est frente al suyo.   Masajee suavemente la mama. Con las yemas de los dedos, masajee la pared del pecho hacia el pezn en un movimiento circular. Esto  estimula el flujo de [place]. Es posible que Engineer, manufacturing systems este movimiento mientras amamanta si la leche fluye lentamente.  Sostenga la mama con el pulgar por arriba del pezn y los otros 4 dedos por debajo de la mama. Asegrese de que los dedos se encuentren lejos del pezn y de la boca del beb.  Empuje  suavemente los labios del beb con el pezn o con el dedo.  Cuando la boca del beb se abra lo suficiente, acrquelo rpidamente a la mama e introduzca todo el pezn y la zona oscura que lo rodea (areola), tanto como sea posible, dentro de la boca del beb.  Debe haber ms areola visible por arriba del labio superior del beb que por debajo del labio inferior.  La lengua del beb debe estar entre la enca inferior y la [place].  Asegrese de que la boca del beb est en la posicin correcta alrededor del pezn (prendida). Los labios del beb deben crear un sello sobre la mama y estar doblados hacia afuera (invertidos).  Es comn que el beb succione durante 2 a 3 minutos para que comience el flujo de [place]. Cmo debe prenderse Es muy importante que le ensee al beb cmo prenderse adecuadamente a la mama. Si el beb no se prende adecuadamente, puede causarle dolor en el pezn y reducir la produccin de [place], y hacer que el beb tenga un escaso aumento de [place]. Adems, si el beb no se prende adecuadamente al pezn, puede tragar aire durante la alimentacin. Esto puede causarle molestias al beb. Hacer eructar al beb al Pilar Plate de mama puede ayudarlo a liberar el aire. Sin embargo, ensearle al beb cmo prenderse a la mama adecuadamente es la mejor manera de evitar que se sienta molesto por tragar Oceanographer se alimenta. Signos de que el beb se ha prendido adecuadamente al pezn:   Payton Doughty o succiona de modo silencioso, sin causarle dolor.  Se escucha que traga cada 3 o 4 succiones.  Hay movimientos musculares por arriba y por delante de sus odos al Printmaker. Signos de que el beb no se ha prendido Audiological scientist al pezn:   Hace ruidos de succin o de chasquido mientras se alimenta.  Siente dolor en el pezn. Si cree que el beb no se prendi correctamente, deslice el dedo en la comisura de la boca y [employer] Corporation las encas del beb para interrumpir la succin.  Intente comenzar a amamantar nuevamente. Signos de Fish farm manager Signos del beb:   Disminuye gradualmente el nmero de succiones o cesa la succin por completo.  Se duerme.  Relaja el cuerpo.  Retiene una pequea cantidad de [facility] boca.  Se desprende solo del pecho. Signos que presenta usted:  Las mamas han aumentado la firmeza, el peso y el tamao 1 a 3 horas despus de Museum/gallery exhibitions officer.  Estn ms blandas inmediatamente despus de amamantar.  Un aumento del volumen de [place], y tambin un cambio en su consistencia y color se producen hacia el quinto da de Tour manager.  Los pezones no duelen, ni estn agrietados ni sangran. Signos de que su beb recibe la cantidad de leche suficiente  Moja al menos 3 paales en 24 horas. La orina debe ser clara y de color amarillo plido a los [address]  [address] de [place].  Defeca al menos 3 veces en 24 horas a los 5 [address]  [address] de [address] La materia fecal debe ser blanda y [place].  Defeca al menos 3 veces en 24 horas a los 7  das de vida. La materia fecal debe ser grumosa y Granvilleamarillenta.  No registra una prdida de peso mayor del 10% del peso al nacer durante los primeros 3 [address]  [address] de [place]ida.  Aumenta de peso un promedio de 4 a 7onzas (113 a 198g) por semana despus de los 4 [address]  [address] de vida.  Aumenta de Hardypeso, Union Dalediariamente, de Garlandmanera uniforme a Glass blower/designerpartir de los 5 [address]  [address] de vida, sin Passenger transport managerregistrar prdida de peso despus de las 2semanas de vida. Despus de alimentarse, es posible que el beb regurgite una pequea cantidad. Esto es frecuente. FRECUENCIA Y DURACIN DE LA LACTANCIA MATERNA El amamantamiento frecuente la ayudar a producir ms Azerbaijanleche y a Education officer, communityprevenir problemas de Engineer, miningdolor en los pezones e hinchazn en las Chesneemamas. Alimente al beb cuando muestre signos de hambre o si siente la necesidad de reducir la congestin de las [place]mamas. Esto se denomina "lactancia a demanda". Evite el uso del chupete mientras trabaja para establecer la lactancia (las primeras 4 a 6  semanas despus del nacimiento del beb). Despus de este perodo, podr ofrecerle un chupete. Las investigaciones demostraron que el uso del chupete durante el primer ao de vida del beb disminuye el riesgo de desarrollar el sndrome de muerte sbita del lactante (SMSL). Permita que el nio se alimente en cada mama todo lo que desee. Contine amamantando al beb hasta que haya terminado de alimentarse. Cuando el beb se desprende o se queda dormido mientras se est alimentando de la primera mama, ofrzcale la segunda. Debido a que, con frecuencia, los recin Sunoconacidos permanecen somnolientos las primeras semanas de vida, es posible que deba despertar al beb para alimentarlo. Los horarios de Acupuncturistlactancia varan de un beb a otro. Sin embargo, las siguientes reglas pueden servir como gua para ayudarla a Lawyergarantizar que el beb se alimenta adecuadamente:  Se puede amamantar a los recin nacidos (bebs de 4 semanas o menos de vida) cada 1 a 3 horas.  No deben transcurrir ms de 3 horas durante el da o 5 horas durante la noche sin que se amamante a los recin nacidos.  Debe amamantar al beb 8 veces como mnimo en un perodo de 24 horas, hasta que comience a introducir slidos en su dieta, a los 6 meses de vida aproximadamente. EXTRACCIN DE Dean Foods CompanyLECHE MATERNA La extraccin y Contractorel almacenamiento de la leche materna le permiten asegurarse de que el beb se alimente exclusivamente de [place] materna, aun en momentos en los que no puede amamantar. Esto tiene especial importancia si debe regresar al Aleen Campitrabajo en el perodo en que an est amamantando o si no puede estar presente en los momentos en que el beb debe alimentarse. Su asesor en lactancia puede orientarla sobre cunto tiempo es seguro almacenar Harlingenleche materna.  El sacaleche es un aparato que le permite extraer leche de la mama a un recipiente estril. Luego, la leche materna extrada puede almacenarse en un refrigerador o Electrical engineercongelador. Algunos sacaleches son Birdie Riddlemanuales,  Delaney Meigsmientras que otros son elctricos. Consulte a su asesor en lactancia qu tipo ser ms conveniente para usted. Los sacaleches se pueden comprar; sin embargo, algunos hospitales y grupos de apoyo a la lactancia materna alquilan Sports coachsacaleches mensualmente. Un asesor en lactancia puede ensearle cmo extraer W. R. Berkleyleche materna manualmente, en caso de que prefiera no usar un sacaleche.  CMO CUIDAR LAS MAMAS DURANTE LA LACTANCIA MATERNA Los pezones se secan, agrietan y duelen durante la Tour managerlactancia materna. Las siguientes recomendaciones pueden ayudarla a Pharmacologistmantener las TEPPCO Partnersmamas humectadas y sanas:  Careers information officervite usar jabn en los pezones.  Use  un sostn de soporte. Aunque no son esenciales, las camisetas sin mangas o los sostenes especiales para Museum/gallery exhibitions officer estn diseados para acceder fcilmente a las mamas, para Museum/gallery exhibitions officer sin tener que quitarse todo el sostn o la camiseta. Evite usar sostenes con aro o sostenes muy ajustados.  Seque al aire sus pezones durante 3 a despus de amamantar al beb.  Utilice solo apsitos de Haematologist sostn para Environmental health practitioner las prdidas de [place]. La prdida de un poco de [employer] tomas es normal.  Utilice lanolina sobre los pezones luego de Museum/gallery exhibitions officer. La lanolina ayuda a mantener la humedad normal de la piel. Si [place] lanolina pura, no tiene que lavarse los pezones antes de volver a Corporate treasurer al beb. La lanolina pura no es txica para el beb. Adems, puede extraer [employer] algunas gotas de Walker materna y Engineer, maintenance (IT) suavemente esa [employer], para que la [place] se seque al aire. Durante las primeras semanas despus de dar a luz, algunas mujeres pueden experimentar hinchazn en las mamas (congestin [place]). La congestin puede hacer que sienta las mamas pesadas, calientes y sensibles al tacto. El pico de la congestin ocurre dentro de los 3 a 5 das despus del [place]. Las siguientes recomendaciones pueden ayudarla a Paramedic la congestin:  Vace por completo  las mamas al [employer]COMM o Environmental health practitioner. Puede aplicar calor hmedo en las mamas (en la ducha o con toallas hmedas para manos) antes de Museum/gallery exhibitions officer o extraer [employer]. Esto aumenta la circulacin y [place] a que la [place]. Si el beb no vaca por completo las [address] cuando lo [address], extraiga la Daniels restante despus de que haya finalizado.  Use un sostn ajustado (para amamantar o comn) o una camiseta sin mangas durante 1 o 2 das para indicar al cuerpo que disminuya ligeramente la produccin de [place].  Aplique compresas de hielo Yahoo[employer] Inc, a menos que le resulte demasiado incmodo.  Asegrese de que el beb est prendido y se encuentre en la posicin correcta mientras lo alimenta. Si la congestin persiste luego de 48 horas o despus de seguir estas recomendaciones, comunquese con su mdico o un Holiday representative. RECOMENDACIONES GENERALES PARA EL CUIDADO DE LA SALUD DURANTE LA LACTANCIA MATERNA  Consuma alimentos saludables. Alterne comidas y colaciones, y coma 3 de cada una por da. Dado que lo que come Danaher Corporation, es posible que algunas comidas hagan que su beb se vuelva ms irritable de lo habitual. Evite comer este tipo de alimentos si percibe que afectan de manera negativa al beb.  Beba leche, jugos de fruta y agua para Patent examiner su sed (aproximadamente 10 vasos al Futures trader).  Descanse con frecuencia, reljese y tome sus vitaminas prenatales para evitar la fatiga, el estrs y la anemia.  Contine con los autocontroles de la mama.  Evite Product manager y fumar tabaco. Las sustancias qumicas de los cigarrillos que pasan a la leche materna y la exposicin al humo ambiental del tabaco pueden daar al beb.  No consuma alcohol ni drogas, incluida la marihuana. Algunos medicamentos, que pueden ser perjudiciales para el beb, pueden pasar a travs de la Col[employer] Pal[employer]. Es importante que consulte a su mdico antes de Medical sales representative, incluidos todos los medicamentos  recetados y de [place], as como los suplementos vitamnicos y herbales. Puede quedar embarazada durante la lactancia. Si desea controlar la natalidad, consulte a su mdico cules son las opciones ms seguras para el beb. SOLICITE ATENCIN MDICA SI:   Usted siente  que quiere dejar de Museum/gallery exhibitions officer o se siente frustrada con la lactancia.  Siente dolor en las mamas o en los pezones.  Sus pezones estn agrietados o Water quality scientist.  Sus pechos estn irritados, sensibles o calientes.  Tiene un rea hinchada en cualquiera de las mamas.  Siente escalofros o fiebre.  Tiene nuseas o vmitos.  Presenta una secrecin de otro lquido distinto de la leche materna de los pezones.  Sus mamas no se llenan antes de Museum/gallery exhibitions officer al beb para el quinto da despus del [place].  Se siente triste y deprimida.  El beb est demasiado somnoliento como para comer bien.  El beb tiene problemas para dormir.  Moja menos de 3 paales en 24 horas.  Defeca menos de 3 veces en 24 horas.  La piel del beb o la parte blanca de los ojos se vuelven amarillentas.  El beb no ha aumentado de [place] a los [address] de [place]. SOLICITE ATENCIN MDICA DE INMEDIATO SI:   El beb est muy cansado Retail buyer) y no se quiere despertar para comer.  Le sube la fiebre sin causa.   Esta informacin no tiene Theme park manager el consejo del mdico. Asegrese de hacerle al mdico cualquier pregunta que tenga.   Document Released: 11/13/2005 Document Revised: 08/04/2015 Elsevier Interactive Patient Education 2016 [employer].  Informacin sobre la prueba de Ken Caryl de parto despus de Neomia Dear cesrea (Trial of Labor After Cesarean Delivery Information) La prueba de [place] de parto despus de Neomia Dear cesrea (TOLAC por sus siglas en ingls) es cuando una mujer trata de dar a luz por va vaginal despus de una cesrea anterior. La TOLAC puede ser Neomia Dear opcin segura y [place] segn la historia clnica y otros factores de [place]. Cuando  TOLAC tiene xito y es capaz de tener un parto vaginal, esto se llama un parto vaginal despus de Neomia Dear cesrea (PVDC).  CANDIDATAS PARA TOLAC Este procedimiento es posible para algunas mujeres que:  Hayan sido sometidas a uno o dos partos por cesrea en los que la incisin del tero fue horizontal (transversal baja).  Estn esperando gemelos y tuvieron una incisin transversal baja durante una cesrea.  No tienen una cicatriz uterina vertical (clsica).  No han tenido una ruptura en la pared del tero (ruptura uterina). El TOLAC tambin puede intentarse en mujeres que cumplen con los criterios apropiados:  Son menores de [address]  Son altas y tienen un ndice de masa corporal [facility]) inferior a 30.  . Fredderick Phenix cicatriz uterina desconocida.  Dan a luz en un centro equipado para realizar un parto por cesrea de emergencia. Este equipo debe ser capaz de Company secretary las posibles complicaciones, como una ruptura uterina.  Tienen asesoramiento completo United Stationers beneficios y riesgos del TOLAC.  Han comentado acerca de futuros planes de embarazo con su mdico.  Han planificado tener varios embarazos ms. CANDIDATAS A TENER MS XITO EN TOLAC:  Han tenido un parto vaginal exitoso antes o despus de su parto por cesrea.  Experimentan un trabajo de parto que comienza, naturalmente, en o antes de la fecha estimada (40 semanas de gestacin).  El beb no es muy grande ( macrosmico).   Han tenido un parto por cesrea anterior, pero actualmente no hay factores que promoveran un parto por cesrea (como una posicin de [place]).  Han tenido un solo parto por cesrea anteriormente.  Tuvo un parto por cesrea antes de realizar el [place] de parto y no despus de Teacher, English as a foreign language. TOLAC puede ser ms apropiado para mujeres  que cumplen con las normas anteriores y que planean tener ms embarazos. No se recomienda en partos domiciliarios. CANDIDATAS A TENER MENOS XITO EN TOLAC:  Tienen un  parto inducido con un cuello uterino desfavorable. Un cuello uterino desfavorable es cuando no se dilata lo suficiente (entre otros factores).  Nunca han tenido un parto vaginal.  Han tenido ms de dos partos por cesrea.  Tienen un embarazo de ms de 40 semanas de gestacin.  Est embarazada de un un beb con sospecha de un peso mayor de 4.000 gramos (8  libras) y no tiene antecedentes de un parto vaginal.  Han tenido embarazos muy cercanos. BENEFICIOS SUGERIDOS DE LA TOLAC  Tiempo de recuperacin ms rpido.  Permanencia ms breve en el hospital.  Menos dolor y problemas que en un parto por cesrea. Las [employer] tienen un parto por cesrea tienen una mayor probabilidad de necesitar sangre o tener fiebre, una infeccin o un cogulo sanguneo en las piernas. RIESGOS SUGERIDOS DE LA TOLAC El riesgo ms alto de complicaciones ocurre en mujeres que intentan un TOLAC y fracasan. Una TOLAC que fracasa resulta en una cesrea no planificada. Los riesgos relacionados con TOLAC o cesreas repetidas son:   Lulu Riding.  Infeccin.  Cogulos sanguneos.  Lesiones en los rganos o tejidos circundantes.  Menor riesgo de remocin del tero (histerectoma).  Posibles problemas con la placenta (como placenta previa o placenta acreta) en embarazos futuros. Aunque es muy raro, las preocupaciones principales con el TOLAC son:  Ruptura de la cicatriz uterina de una cesrea anterior.  Necesidad de una cesrea de Associate Professor.  Tener un mal resultado para el beb (morbilidad perinatal). Irven Shelling MS INFORMACIN [employer] of Obstetricians and Gynecologists (Colegio Estadounidense de Obstetras y Gineclogos): www.acog.org [employer] of Nurse-Midwives (Colegio Estadounidense de Enfermeras - parteras): www.midwife.org   Esta informacin no tiene Theme park manager el consejo del mdico. Asegrese de hacerle al mdico cualquier pregunta que tenga.   Document Released:  11/02/2011 Document Revised: 09/03/2013 Elsevier Interactive Patient Education [employer]

## 2016-03-20 NOTE — Progress Notes (Signed)
   Subjective:    Michelle Rice is a [id number] being seen today for her first obstetrical visit.  Her obstetrical history is significant for GDM, H/o PTL, previous c/s followed by 2 VBAC, obesity. Patient does intend to breast feed. Pregnancy history fully reviewed.  Patient reports no complaints.  Filed Vitals:   03/20/16 0919  BP: 116/63  Pulse: 83  Weight: 180 lb 3.2 oz (81.738 kg)    HISTORY: OB History  Gravida Para Term Preterm AB SAB TAB Ectopic Multiple Living  5 3 1 2 1 1  0 0 0 3    # Outcome Date GA Lbr Len/2nd Weight Sex Delivery Anes PTL Lv  5 Current           4 SAB 02/16/15 [id number]    SAB     3 Preterm 09/10/09 368w0d  4 lb 4 oz (1.928 kg) M Jerral RalphVag-Spont  Y Y  2 Term 11/10/06 338w0d  5 lb 11 oz (2.58 kg) F Vag-Spont   Y  1 Preterm 12/22/00 430w0d  2 lb 2 oz (0.964 kg) M CS-Unspec Spinal Y Y     Complications: Breech presentation     Past Medical History  Diagnosis Date  . Preterm labor    Past Surgical History  Procedure Laterality Date  . Cesarean section     Family History  Problem Relation Age of Onset  . Hypertension Father      Exam        Assessment:    Pregnancy: [id number] Patient Active Problem List   Diagnosis Date Noted  . Previous cesarean section complicating pregnancy, antepartum condition or complication 03/20/2016  . Gestational diabetes mellitus (GDM) affecting pregnancy, antepartum (HCC) 03/20/2016  . Supervision of high risk pregnancy, antepartum 03/04/2016  . H/O preterm delivery, currently pregnant 03/04/2016        Plan:     Initial labs drawn. Prenatal vitamins. Problem list reviewed and updated. Genetic Screening discussed Quad Screen: results reviewed.  Ultrasound discussed; fetal survey: results reviewed. Patient with GDM. Risks of poorly controlled diabetes reviewed with the patient including but not limited to risks of fetal macrosomia, shoulder dystocia, intrauterine fetal demise, neonatal seizures and  death.  Patient will meet with diabetic educator Baseline labs obtained Rx ASA provided Patient with h/o PTL and desires 17-p weekly. Application started today  Follow up in 2 weeks. 50% of 30 min visit spent on counseling and coordination of care.     Michelle Rice 03/20/2016

## 2016-03-20 NOTE — Progress Notes (Signed)
Nutrition note: GDM diet f/u Pt has gained 0.2# @ 3840w5d, which is < expected. Pt has been checking her BG- fasting: 66-95, 2hr pp: 65-128 Pt reports eating 2-3 meals & 2-3 snacks/d. When asking about the 65 BG 2hr after dinner, pt reports she has been avoiding eating after 5pm & only drinks water because she stated she was nervous about her BG. Re-educated pt about importance of eating regularly spaced meals & snacks (pt reports she used to eat dinner ~6 or 7pm but stopped when she found out she has GDM). Encouraged pt to go back to eating her dinner with her family. Pt is taking a PNV. Pt does not have WIC but plans to register soon. Pt plans to BF. F/u in 4-6 wks Blondell RevealLaura Yarethzy Croak, MS, RD, LDN, [facility]

## 2016-03-20 NOTE — Progress Notes (Deleted)
Nutrition note: GDM diet f/u Pt has gained

## 2016-03-22 ENCOUNTER — Ambulatory Visit (HOSPITAL_COMMUNITY): Payer: Self-pay

## 2016-03-22 ENCOUNTER — Telehealth: Payer: Self-pay | Admitting: *Deleted

## 2016-03-22 NOTE — Telephone Encounter (Signed)
Received a voicemail from [facility] yesterday stating patient was [facility].  Need to schedule delivery. Called [facility] and Cruzita Lederermakena should be delivered tomorrow.

## 2016-03-27 ENCOUNTER — Ambulatory Visit (INDEPENDENT_AMBULATORY_CARE_PROVIDER_SITE_OTHER): Payer: Self-pay

## 2016-03-27 DIAGNOSIS — O09212 Supervision of pregnancy with history of pre-term labor, second trimester: Secondary | ICD-10-CM

## 2016-03-27 DIAGNOSIS — O09892 Supervision of other high risk pregnancies, second trimester: Secondary | ICD-10-CM

## 2016-03-27 MED ORDER — HYDROXYPROGESTERONE CAPROATE 250 MG/ML IM OIL
250.0000 mg | TOPICAL_OIL | INTRAMUSCULAR | Status: AC
  Administered 2016-03-27 – 2016-07-10 (×13): 250 mg via INTRAMUSCULAR

## 2016-03-29 ENCOUNTER — Other Ambulatory Visit (HOSPITAL_COMMUNITY): Payer: Self-pay | Admitting: Maternal and Fetal Medicine

## 2016-03-29 ENCOUNTER — Ambulatory Visit (HOSPITAL_COMMUNITY)
Admission: RE | Admit: 2016-03-29 | Discharge: 2016-03-29 | Disposition: A | Discharge status: Home / Self Care | Payer: Self-pay | Source: Ambulatory Visit | Attending: Nurse Practitioner | Admitting: Nurse Practitioner

## 2016-03-29 ENCOUNTER — Encounter (HOSPITAL_COMMUNITY): Payer: Self-pay

## 2016-03-29 DIAGNOSIS — O09292 Supervision of pregnancy with other poor reproductive or obstetric history, second trimester: Secondary | ICD-10-CM | POA: Insufficient documentation

## 2016-03-29 DIAGNOSIS — O24419 Gestational diabetes mellitus in pregnancy, unspecified control: Secondary | ICD-10-CM

## 2016-03-29 DIAGNOSIS — O09212 Supervision of pregnancy with history of pre-term labor, second trimester: Secondary | ICD-10-CM | POA: Insufficient documentation

## 2016-03-29 DIAGNOSIS — Z98891 History of uterine scar from previous surgery: Secondary | ICD-10-CM

## 2016-03-29 DIAGNOSIS — Z3A21 21 weeks gestation of pregnancy: Secondary | ICD-10-CM | POA: Insufficient documentation

## 2016-03-29 DIAGNOSIS — O34219 Maternal care for unspecified type scar from previous cesarean delivery: Secondary | ICD-10-CM | POA: Insufficient documentation

## 2016-03-29 DIAGNOSIS — O24410 Gestational diabetes mellitus in pregnancy, diet controlled: Secondary | ICD-10-CM | POA: Insufficient documentation

## 2016-04-03 ENCOUNTER — Ambulatory Visit (INDEPENDENT_AMBULATORY_CARE_PROVIDER_SITE_OTHER): Payer: Self-pay | Admitting: Advanced Practice Midwife

## 2016-04-03 VITALS — BP 116/68 | HR 54 | Temp 98.3°F | Wt 184.9 lb

## 2016-04-03 DIAGNOSIS — O09212 Supervision of pregnancy with history of pre-term labor, second trimester: Secondary | ICD-10-CM

## 2016-04-03 DIAGNOSIS — O09892 Supervision of other high risk pregnancies, second trimester: Secondary | ICD-10-CM

## 2016-04-03 DIAGNOSIS — O24912 Unspecified diabetes mellitus in pregnancy, second trimester: Secondary | ICD-10-CM

## 2016-04-03 DIAGNOSIS — O0992 Supervision of high risk pregnancy, unspecified, second trimester: Secondary | ICD-10-CM

## 2016-04-03 DIAGNOSIS — O24419 Gestational diabetes mellitus in pregnancy, unspecified control: Secondary | ICD-10-CM

## 2016-04-03 LAB — POCT URINALYSIS DIP (DEVICE)
Bilirubin Urine: NEGATIVE
Glucose, UA: 100 mg/dL — AB
HGB URINE DIPSTICK: NEGATIVE
Ketones, ur: NEGATIVE mg/dL
Leukocytes, UA: NEGATIVE
NITRITE: NEGATIVE
PH: 5.5 (ref 5.0–8.0)
PROTEIN: NEGATIVE mg/dL
Specific Gravity, Urine: <=1.005 (ref 1.005–1.030)
UROBILINOGEN UA: 0.2 mg/dL (ref 0.0–1.0)

## 2016-04-03 LAB — HEMOGLOBIN A1C
Hgb A1c MFr Bld: 5.7 % — ABNORMAL HIGH (ref <5.7)
Mean Plasma Glucose: 117 mg/dL

## 2016-04-03 NOTE — Patient Instructions (Signed)

## 2016-04-03 NOTE — Progress Notes (Signed)
Subjective:  [name] is a 35 y.o. [phone] at 1065w5d being seen today for ongoing prenatal care.  She is currently monitored for the following issues for this high-risk pregnancy and has Supervision of high risk pregnancy, antepartum; H/O preterm delivery, currently pregnant; Previous cesarean section complicating pregnancy, antepartum condition or complication; and Gestational diabetes mellitus (GDM) affecting pregnancy, antepartum (HCC) on her problem list.  Patient reports pressure a few times per day. No change over past several weeks.  Contractions: Not present. Vag. Bleeding: None.  Movement: Present. Denies leaking of fluid.   The following portions of the patient's history were reviewed and updated as appropriate: allergies, current medications, past family history, past medical history, past social history, past surgical history and problem list. Problem list updated.  Objective:   Filed Vitals:   04/03/16 1106  BP: 116/68  Pulse: 54  Temp: 98.3 F (36.8 C)  Weight: 184 lb 14.4 oz (83.87 kg)    Fetal Status: Fetal Heart Rate (bpm): 140   Movement: Present     General:  Alert, oriented and cooperative. Patient is in no acute distress.  Skin: Skin is warm and dry. No rash noted.   Cardiovascular: Normal heart rate noted  Respiratory: Normal respiratory effort, no problems with respiration noted  Abdomen: Soft, gravid, appropriate for gestational age. Pain/Pressure: Present     Pelvic: Vag. Bleeding: None     Cervical exam declined        Extremities: Normal range of motion.  Edema: None  Mental Status: Normal mood and affect. Normal behavior. Normal judgment and thought content.   Urinalysis: Urine Protein: Negative Urine Glucose: 1+   03/30/16 US for CL 3.33 cm    Fasting CBGs: 74-95 (1/7 out of range) PCB:  115-120 (0/7 out of range) PCL:  125-130 (7/7% out of range) PCD: 100 (0/7% out of range)  Assessment and Plan:  Pregnancy: [id number] at 4765w5d  1.  Diabetes mellitus complicating pregnancy, antepartum, second trimester  - Hemoglobin A1c  - Will try dietary changes to decrease PC lunch CBGS. May need to start PO meds.   2. Supervision of high risk pregnancy, antepartum, second trimester   3. Hx PTB  - Pt requesting to stop CL's Q 2 weeks due to cost. Reviewed records. No Hx of cervical insufficiency this or previous pregnancies. Consulted w/ Dr. Adrian BlackwaterStinson who consulted Dr. Claudean SeveranceWhitecar. CL' s do NOT need to continue unless pt becomes symptomatic.  - Continue 17-P  Preterm labor symptoms and general obstetric precautions including but not limited to vaginal bleeding, contractions, leaking of fluid and fetal movement were reviewed in detail with the patient. Please refer to After Visit Summary for other counseling recommendations.  Return in about 1 week (around 04/10/2016).  Dorathy KinsmanVirginia Maribell Demeo, CNM

## 2016-04-03 NOTE — Progress Notes (Signed)
Breastfeeding tip of the week reviewed 17-p today Spanish interpreter present for visit

## 2016-04-05 ENCOUNTER — Telehealth: Payer: Self-pay

## 2016-04-05 NOTE — Telephone Encounter (Signed)
Dorathy KinsmanVirginia Smith, CNM requests pt to come in to pick up supplies and to be explained for 24 hr urine collection.

## 2016-04-10 ENCOUNTER — Ambulatory Visit (INDEPENDENT_AMBULATORY_CARE_PROVIDER_SITE_OTHER): Payer: Self-pay | Admitting: Family Medicine

## 2016-04-10 VITALS — BP 102/62 | HR 56 | Wt 183.0 lb

## 2016-04-10 DIAGNOSIS — O24419 Gestational diabetes mellitus in pregnancy, unspecified control: Secondary | ICD-10-CM

## 2016-04-10 DIAGNOSIS — O24919 Unspecified diabetes mellitus in pregnancy, unspecified trimester: Secondary | ICD-10-CM

## 2016-04-10 DIAGNOSIS — O09892 Supervision of other high risk pregnancies, second trimester: Secondary | ICD-10-CM

## 2016-04-10 DIAGNOSIS — O0992 Supervision of high risk pregnancy, unspecified, second trimester: Secondary | ICD-10-CM

## 2016-04-10 DIAGNOSIS — O09212 Supervision of pregnancy with history of pre-term labor, second trimester: Secondary | ICD-10-CM

## 2016-04-10 LAB — POCT URINALYSIS DIP (DEVICE)
Bilirubin Urine: NEGATIVE
Glucose, UA: NEGATIVE mg/dL
KETONES UR: NEGATIVE mg/dL
Leukocytes, UA: NEGATIVE
Nitrite: NEGATIVE
PH: 5 (ref 5.0–8.0)
PROTEIN: NEGATIVE mg/dL
Urobilinogen, UA: 0.2 mg/dL (ref 0.0–1.0)

## 2016-04-10 NOTE — Progress Notes (Signed)
Raquel used for interpreter  Tip of week discussed with patient

## 2016-04-10 NOTE — Progress Notes (Signed)
Subjective:  Michelle Rice is a 35 y.o. [phone] at 7229w5d being seen today for ongoing prenatal care.  She is currently monitored for the following issues for this high-risk pregnancy and has Supervision of high risk pregnancy, antepartum; H/O preterm delivery, currently pregnant; Previous cesarean section complicating pregnancy, antepartum condition or complication; and Gestational diabetes mellitus (GDM) affecting pregnancy, antepartum (HCC) on her problem list.  GDM: Patient diet controlled.   Fasting: all controlled 2hr PP: 62-154, 12-42 elevated.  Most of these are after lunch.  She has been eating rice and beans.  Patient reports no complaints.  Contractions: Not present. Vag. Bleeding: None.  Movement: Present. Denies leaking of fluid.   The following portions of the patient's history were reviewed and updated as appropriate: allergies, current medications, past family history, past medical history, past social history, past surgical history and problem list. Problem list updated.  Objective:   Filed Vitals:   04/10/16 1047  BP: 102/62  Pulse: 56  Weight: 183 lb (83.008 kg)    Fetal Status: Fetal Heart Rate (bpm): 140   Movement: Present     General:  Alert, oriented and cooperative. Patient is in no acute distress.  Skin: Skin is warm and dry. No rash noted.   Cardiovascular: Normal heart rate noted  Respiratory: Normal respiratory effort, no problems with respiration noted  Abdomen: Soft, gravid, appropriate for gestational age. Pain/Pressure: Present     Pelvic: Vag. Bleeding: None Vag D/C Character: White   Cervical exam deferred        Extremities: Normal range of motion.  Edema: None  Mental Status: Normal mood and affect. Normal behavior. Normal judgment and thought content.   Urinalysis: Urine Protein: Negative Urine Glucose: Negative  Assessment and Plan:  Pregnancy: [id number] at 7129w5d  1. Supervision of high risk pregnancy, antepartum, second  trimester FHT normal  2. H/O preterm delivery, currently pregnant, second trimester 17-P weekly  3. Gestational diabetes mellitus (GDM) affecting pregnancy, antepartum (HCC) Counseled pt regarding diet and exercise.  RTC in 2 weeks to check blood sugars.  Preterm labor symptoms and general obstetric precautions including but not limited to vaginal bleeding, contractions, leaking of fluid and fetal movement were reviewed in detail with the patient. Please refer to After Visit Summary for other counseling recommendations.  No Follow-up on file.   Levie HeritageJacob J Stinson, DO

## 2016-04-12 ENCOUNTER — Inpatient Hospital Stay (HOSPITAL_COMMUNITY): Admission: RE | Admit: 2016-04-12 | Payer: Self-pay | Source: Ambulatory Visit

## 2016-04-17 ENCOUNTER — Ambulatory Visit (INDEPENDENT_AMBULATORY_CARE_PROVIDER_SITE_OTHER): Payer: Self-pay

## 2016-04-17 VITALS — BP 82/66 | HR 72

## 2016-04-17 DIAGNOSIS — O09212 Supervision of pregnancy with history of pre-term labor, second trimester: Secondary | ICD-10-CM

## 2016-04-17 DIAGNOSIS — O09892 Supervision of other high risk pregnancies, second trimester: Secondary | ICD-10-CM

## 2016-04-17 NOTE — Progress Notes (Signed)
Pt presented today for her Makena Injection. Pt tolerated well and will follow up in one week for her next injections.

## 2016-04-25 ENCOUNTER — Ambulatory Visit: Payer: Self-pay

## 2016-04-26 ENCOUNTER — Ambulatory Visit (HOSPITAL_COMMUNITY)
Admission: RE | Admit: 2016-04-26 | Discharge: 2016-04-26 | Disposition: A | Discharge status: Home / Self Care | Payer: Self-pay | Source: Ambulatory Visit | Attending: Nurse Practitioner | Admitting: Nurse Practitioner

## 2016-04-27 ENCOUNTER — Encounter: Payer: Self-pay | Admitting: Obstetrics & Gynecology

## 2016-05-01 ENCOUNTER — Ambulatory Visit (INDEPENDENT_AMBULATORY_CARE_PROVIDER_SITE_OTHER): Payer: Self-pay | Admitting: Obstetrics and Gynecology

## 2016-05-01 VITALS — BP 121/75 | HR 59 | Wt 185.2 lb

## 2016-05-01 DIAGNOSIS — O24419 Gestational diabetes mellitus in pregnancy, unspecified control: Secondary | ICD-10-CM

## 2016-05-01 DIAGNOSIS — O24912 Unspecified diabetes mellitus in pregnancy, second trimester: Secondary | ICD-10-CM

## 2016-05-01 DIAGNOSIS — O0992 Supervision of high risk pregnancy, unspecified, second trimester: Secondary | ICD-10-CM

## 2016-05-01 DIAGNOSIS — O09892 Supervision of other high risk pregnancies, second trimester: Secondary | ICD-10-CM

## 2016-05-01 DIAGNOSIS — O09212 Supervision of pregnancy with history of pre-term labor, second trimester: Secondary | ICD-10-CM

## 2016-05-01 DIAGNOSIS — O34219 Maternal care for unspecified type scar from previous cesarean delivery: Secondary | ICD-10-CM

## 2016-05-01 LAB — POCT URINALYSIS DIP (DEVICE)
BILIRUBIN URINE: NEGATIVE
GLUCOSE, UA: NEGATIVE mg/dL
Hgb urine dipstick: NEGATIVE
Ketones, ur: NEGATIVE mg/dL
LEUKOCYTES UA: NEGATIVE
NITRITE: NEGATIVE
Protein, ur: NEGATIVE mg/dL
Specific Gravity, Urine: 1.01 (ref 1.005–1.030)
Urobilinogen, UA: 0.2 mg/dL (ref 0.0–1.0)
pH: 7 (ref 5.0–8.0)

## 2016-05-01 NOTE — Progress Notes (Signed)
Spanish Interpreter Carol Hernandez  

## 2016-05-01 NOTE — Progress Notes (Signed)
Subjective:  Michelle Rice is a 35 y.o. [phone] at 5495w5d being seen today for ongoing prenatal care.  She is currently monitored for the following issues for this high-risk pregnancy and has Supervision of high risk pregnancy, antepartum; H/O preterm delivery, currently pregnant; Previous cesarean section complicating pregnancy, antepartum condition or complication; and Gestational diabetes mellitus (GDM) affecting pregnancy, antepartum (HCC) on her problem list.   Patient reports no complaints.   The following portions of the patient's history were reviewed and updated as appropriate: allergies, current medications, past family history, past medical history, past social history, past surgical history and problem list. Problem list updated.  Objective:   Filed Vitals:   05/01/16 0957  BP: 121/75  Pulse: 59  Weight: 185 lb 3.2 oz (84.006 kg)    Fetal Status: Fetal Heart Rate (bpm): 141   Movement: Present     General:  Alert, oriented and cooperative. Patient is in no acute distress.  Skin: Skin is warm and dry. No rash noted.   Cardiovascular: Normal heart rate noted  Respiratory: Normal respiratory effort, no problems with respiration noted  Abdomen: Soft, gravid, appropriate for gestational age. Pain/Pressure: Present     Pelvic:  deferred  Extremities: Normal range of motion.  Edema: None  Mental Status: Normal mood and affect. Normal behavior. Normal judgment and thought content.   Urinalysis:      Assessment and Plan:  Pregnancy: A5W0981G5P1213 at 7295w5d *IUP: routine care. 3rd trimester labs nv. *GDMa1: good BS log. A few rare 2hr PP lunch ones in the 130s-140s so pt told to keep an eye on diet for these *h/o c-section: needs scheduling done at 28-32wks. Can follow up if desires BTL then too *h/o PTL: continue with qwk 17p today   Preterm labor symptoms and general obstetric precautions including but not limited to vaginal bleeding, contractions, leaking of fluid and  fetal movement were reviewed in detail with the patient. Please refer to After Visit Summary for other counseling recommendations.  Return in about 3 weeks (around 05/22/2016).   Holland Bingharlie Damisha Wolff, MD

## 2016-05-08 ENCOUNTER — Ambulatory Visit (INDEPENDENT_AMBULATORY_CARE_PROVIDER_SITE_OTHER): Payer: Self-pay | Admitting: General Practice

## 2016-05-08 VITALS — BP 116/54 | HR 63 | Ht 62.0 in | Wt 185.0 lb

## 2016-05-08 DIAGNOSIS — O09212 Supervision of pregnancy with history of pre-term labor, second trimester: Secondary | ICD-10-CM

## 2016-05-15 ENCOUNTER — Ambulatory Visit (INDEPENDENT_AMBULATORY_CARE_PROVIDER_SITE_OTHER): Payer: Self-pay | Admitting: General Practice

## 2016-05-15 DIAGNOSIS — O09212 Supervision of pregnancy with history of pre-term labor, second trimester: Secondary | ICD-10-CM

## 2016-05-15 NOTE — Telephone Encounter (Signed)
Michelle CahillSusan Ross attempted to contact pt several times w/o success.  Contacted pt today and requested that she comes in this week for 24 hour urine collection supplies.  Pt stated that she will be able to come in tomorrow at 1500.

## 2016-05-16 ENCOUNTER — Other Ambulatory Visit: Payer: Self-pay

## 2016-05-22 ENCOUNTER — Ambulatory Visit (INDEPENDENT_AMBULATORY_CARE_PROVIDER_SITE_OTHER): Payer: Self-pay | Admitting: Obstetrics and Gynecology

## 2016-05-22 VITALS — BP 120/75 | HR 79 | Wt 183.1 lb

## 2016-05-22 DIAGNOSIS — O34219 Maternal care for unspecified type scar from previous cesarean delivery: Secondary | ICD-10-CM

## 2016-05-22 DIAGNOSIS — O09893 Supervision of other high risk pregnancies, third trimester: Secondary | ICD-10-CM

## 2016-05-22 DIAGNOSIS — O09213 Supervision of pregnancy with history of pre-term labor, third trimester: Secondary | ICD-10-CM

## 2016-05-22 DIAGNOSIS — O0993 Supervision of high risk pregnancy, unspecified, third trimester: Secondary | ICD-10-CM

## 2016-05-22 DIAGNOSIS — O24919 Unspecified diabetes mellitus in pregnancy, unspecified trimester: Secondary | ICD-10-CM

## 2016-05-22 DIAGNOSIS — O24419 Gestational diabetes mellitus in pregnancy, unspecified control: Secondary | ICD-10-CM

## 2016-05-22 LAB — CBC
HEMATOCRIT: 34.1 % — AB (ref 35.0–45.0)
Hemoglobin: 11.7 g/dL (ref 11.7–15.5)
MCH: 28.3 pg (ref 27.0–33.0)
MCHC: 34.3 g/dL (ref 32.0–36.0)
MCV: 82.6 fL (ref 80.0–100.0)
MPV: 10.4 fL (ref 7.5–12.5)
Platelets: 260 10*3/uL (ref 140–400)
RBC: 4.13 MIL/uL (ref 3.80–5.10)
RDW: 14.2 % (ref 11.0–15.0)
WBC: 10 10*3/uL (ref 3.8–10.8)

## 2016-05-22 LAB — POCT URINALYSIS DIP (DEVICE)
Bilirubin Urine: NEGATIVE
GLUCOSE, UA: NEGATIVE mg/dL
Hgb urine dipstick: NEGATIVE
KETONES UR: NEGATIVE mg/dL
Nitrite: NEGATIVE
PH: 6 (ref 5.0–8.0)
PROTEIN: NEGATIVE mg/dL
SPECIFIC GRAVITY, URINE: 1.01 (ref 1.005–1.030)
Urobilinogen, UA: 0.2 mg/dL (ref 0.0–1.0)

## 2016-05-22 NOTE — Progress Notes (Signed)
Spanish interpreter Michelle BreslowCarol Rice Pt will come in on Wed 6/28 @ 1245 to do 28 wk labs with tdap vaccine  [facility]  Expected to arrive June 27th by the end of day.

## 2016-05-22 NOTE — Progress Notes (Signed)
Subjective:  Michelle Rice is a 35 y.o. [phone] at 1973w5d being seen today for ongoing prenatal care.  She is currently monitored for the following issues for this high-risk pregnancy and has Supervision of high risk pregnancy, antepartum; H/O preterm delivery, currently pregnant; Previous cesarean section complicating pregnancy, antepartum condition or complication; and Gestational diabetes mellitus (GDM) affecting pregnancy, antepartum (HCC) on her problem list.  Patient reports no complaints.  Contractions: Not present. Vag. Bleeding: None.  Movement: Present. Denies leaking of fluid.   The following portions of the patient's history were reviewed and updated as appropriate: allergies, current medications, past family history, past medical history, past social history, past surgical history and problem list. Problem list updated.  Objective:   Filed Vitals:   05/22/16 1136  BP: 120/75  Pulse: 79  Weight: 183 lb 1.6 oz (83.054 kg)    Fetal Status: Fetal Heart Rate (bpm): 143 Fundal Height: 28 cm Movement: Present     General:  Alert, oriented and cooperative. Patient is in no acute distress.  Skin: Skin is warm and dry. No rash noted.   Cardiovascular: Normal heart rate noted  Respiratory: Normal respiratory effort, no problems with respiration noted  Abdomen: Soft, gravid, appropriate for gestational age. Pain/Pressure: Present     Pelvic: Cervical exam deferred        Extremities: Normal range of motion.  Edema: None  Mental Status: Normal mood and affect. Normal behavior. Normal judgment and thought content.   Urinalysis: Urine Protein: Negative Urine Glucose: Negative  Assessment and Plan:  Pregnancy: [id number] at 5873w5d  1. Supervision of high risk pregnancy, antepartum, third trimester Patient is doing well without complaints  2. Previous cesarean section complicating pregnancy, antepartum condition or complication Patient desires repeat cesarean section and BTL    3. H/O preterm delivery, currently pregnant, third trimester Continue weekly 17-P  4. Gestational diabetes mellitus (GDM) affecting pregnancy, antepartum (HCC) Patient did not bring log book but reports fasting 70-80's and pp 120's Continue Diet control Baseline labs today  Preterm labor symptoms and general obstetric precautions including but not limited to vaginal bleeding, contractions, leaking of fluid and fetal movement were reviewed in detail with the patient. Please refer to After Visit Summary for other counseling recommendations.  No Follow-up on file.   Catalina AntiguaPeggy Libni Fusaro, MD

## 2016-05-23 LAB — COMPREHENSIVE METABOLIC PANEL
ALK PHOS: 71 U/L (ref 33–115)
ALT: 9 U/L (ref 6–29)
AST: 11 U/L (ref 10–30)
Albumin: 3.7 g/dL (ref 3.6–5.1)
BUN: 9 mg/dL (ref 7–25)
CALCIUM: 8.6 mg/dL (ref 8.6–10.2)
CO2: 23 mmol/L (ref 20–31)
Chloride: 105 mmol/L (ref 98–110)
Creat: 0.53 mg/dL (ref 0.50–1.10)
GLUCOSE: 70 mg/dL (ref 65–99)
POTASSIUM: 4.4 mmol/L (ref 3.5–5.3)
Sodium: 138 mmol/L (ref 135–146)
Total Bilirubin: 0.2 mg/dL (ref 0.2–1.2)
Total Protein: 6.2 g/dL (ref 6.1–8.1)

## 2016-05-23 LAB — PROTEIN, URINE, 24 HOUR
PROTEIN 24H UR: 139 mg/(24.h) (ref <150)
PROTEIN, URINE: 5 mg/dL (ref 5–24)

## 2016-05-23 LAB — HIV ANTIBODY (ROUTINE TESTING W REFLEX): HIV: NONREACTIVE

## 2016-05-23 LAB — RPR

## 2016-05-24 ENCOUNTER — Other Ambulatory Visit: Payer: Self-pay

## 2016-05-24 DIAGNOSIS — O24919 Unspecified diabetes mellitus in pregnancy, unspecified trimester: Secondary | ICD-10-CM

## 2016-05-25 LAB — GLUCOSE TOLERANCE, 1 HOUR (50G) W/O FASTING: GLUCOSE, 1 HR, GESTATIONAL: 123 mg/dL (ref <140)

## 2016-05-29 ENCOUNTER — Ambulatory Visit (INDEPENDENT_AMBULATORY_CARE_PROVIDER_SITE_OTHER): Payer: Self-pay | Admitting: *Deleted

## 2016-05-29 VITALS — BP 103/50 | HR 50

## 2016-05-29 DIAGNOSIS — O09213 Supervision of pregnancy with history of pre-term labor, third trimester: Secondary | ICD-10-CM

## 2016-05-29 DIAGNOSIS — O09893 Supervision of other high risk pregnancies, third trimester: Secondary | ICD-10-CM

## 2016-05-29 NOTE — Progress Notes (Signed)
Pt in for 17 P only. Feeling well, baby is moving.

## 2016-06-01 ENCOUNTER — Encounter (HOSPITAL_COMMUNITY): Payer: Self-pay | Admitting: *Deleted

## 2016-06-01 ENCOUNTER — Other Ambulatory Visit: Payer: Self-pay | Admitting: Obstetrics and Gynecology

## 2016-06-01 ENCOUNTER — Other Ambulatory Visit: Payer: Self-pay | Admitting: Family Medicine

## 2016-06-05 ENCOUNTER — Ambulatory Visit (INDEPENDENT_AMBULATORY_CARE_PROVIDER_SITE_OTHER): Payer: Self-pay | Admitting: General Practice

## 2016-06-05 DIAGNOSIS — O09213 Supervision of pregnancy with history of pre-term labor, third trimester: Secondary | ICD-10-CM

## 2016-06-05 DIAGNOSIS — O09893 Supervision of other high risk pregnancies, third trimester: Secondary | ICD-10-CM

## 2016-06-05 NOTE — Progress Notes (Signed)
Pt presented today for her 17-p injection. Pt tolerated well and will follow up next Monday 06/12/2016 for next injection. Patient verbalizes understanding.

## 2016-06-12 ENCOUNTER — Encounter (HOSPITAL_COMMUNITY): Payer: Self-pay | Admitting: *Deleted

## 2016-06-12 ENCOUNTER — Ambulatory Visit (INDEPENDENT_AMBULATORY_CARE_PROVIDER_SITE_OTHER): Payer: Self-pay | Admitting: Family Medicine

## 2016-06-12 ENCOUNTER — Encounter: Payer: Self-pay | Admitting: Family Medicine

## 2016-06-12 ENCOUNTER — Inpatient Hospital Stay (HOSPITAL_COMMUNITY)
Admission: AD | Admit: 2016-06-12 | Discharge: 2016-06-12 | Disposition: A | Discharge status: Home / Self Care | Payer: Self-pay | Source: Ambulatory Visit | Attending: Obstetrics and Gynecology | Admitting: Obstetrics and Gynecology

## 2016-06-12 ENCOUNTER — Ambulatory Visit: Payer: Self-pay

## 2016-06-12 VITALS — BP 99/58 | HR 55 | Wt 184.9 lb

## 2016-06-12 DIAGNOSIS — O09213 Supervision of pregnancy with history of pre-term labor, third trimester: Secondary | ICD-10-CM

## 2016-06-12 DIAGNOSIS — O24419 Gestational diabetes mellitus in pregnancy, unspecified control: Secondary | ICD-10-CM | POA: Insufficient documentation

## 2016-06-12 DIAGNOSIS — Z3A31 31 weeks gestation of pregnancy: Secondary | ICD-10-CM | POA: Insufficient documentation

## 2016-06-12 DIAGNOSIS — O24913 Unspecified diabetes mellitus in pregnancy, third trimester: Secondary | ICD-10-CM

## 2016-06-12 DIAGNOSIS — O0993 Supervision of high risk pregnancy, unspecified, third trimester: Secondary | ICD-10-CM

## 2016-06-12 DIAGNOSIS — Z23 Encounter for immunization: Secondary | ICD-10-CM

## 2016-06-12 LAB — POCT URINALYSIS DIP (DEVICE)
BILIRUBIN URINE: NEGATIVE
GLUCOSE, UA: NEGATIVE mg/dL
Hgb urine dipstick: NEGATIVE
KETONES UR: NEGATIVE mg/dL
Leukocytes, UA: NEGATIVE
NITRITE: NEGATIVE
Protein, ur: NEGATIVE mg/dL
Specific Gravity, Urine: >=1.03 (ref 1.005–1.030)
Urobilinogen, UA: 0.2 mg/dL (ref 0.0–1.0)
pH: 6 (ref 5.0–8.0)

## 2016-06-12 MED ORDER — TETANUS-DIPHTH-ACELL PERTUSSIS 5-2.5-18.5 LF-MCG/0.5 IM SUSP
0.5000 mL | Freq: Once | INTRAMUSCULAR | Status: AC
  Administered 2016-06-12: 0.5 mL via INTRAMUSCULAR

## 2016-06-12 NOTE — Progress Notes (Signed)
Subjective:  Michelle Rice is a 35 y.o. [phone] at 5376w5d being seen today for ongoing prenatal care.  She is currently monitored for the following issues for this high-risk pregnancy and has Supervision of high risk pregnancy, antepartum; H/O preterm delivery, currently pregnant; Previous cesarean section complicating pregnancy, antepartum condition or complication; and Gestational diabetes mellitus (GDM) affecting pregnancy, antepartum (HCC) on her problem list.  GDM: Patient diet controlled.  Reports no hypoglycemic episodes.  Tolerating medication well Fasting: occasional CBG in 90s, remainder controlled 2hr PP: occasional CBG > 120, but mostly controlled  Patient reports no complaints.  Contractions: Not present. Vag. Bleeding: None.  Movement: Present. Denies leaking of fluid.   The following portions of the patient's history were reviewed and updated as appropriate: allergies, current medications, past family history, past medical history, past social history, past surgical history and problem list. Problem list updated.  Objective:   Filed Vitals:   06/12/16 1540  BP: 99/58  Pulse: 55  Weight: 184 lb 14.4 oz (83.87 kg)    Fetal Status: Fetal Heart Rate (bpm): 120   Movement: Present     General:  Alert, oriented and cooperative. Patient is in no acute distress.  Skin: Skin is warm and dry. No rash noted.   Cardiovascular: Normal heart rate noted  Respiratory: Normal respiratory effort, no problems with respiration noted  Abdomen: Soft, gravid, appropriate for gestational age. Pain/Pressure: Present     Pelvic: Vag. Bleeding: None     Cervical exam deferred        Extremities: Normal range of motion.  Edema: None  Mental Status: Normal mood and affect. Normal behavior. Normal judgment and thought content.   Urinalysis:      Assessment and Plan:  Pregnancy: [id number] at 5476w5d  1. Supervision of high risk pregnancy, antepartum, third trimester FHT and FH normal.   [facility]el [facility] - will send to [facility] for prolonged monitoring.  2. Gestational diabetes mellitus (GDM) affecting pregnancy, antepartum (HCC)   3. Need for Tdap vaccination  - Tdap (BOOSTRIX) injection 0.5 mL; Inject 0.5 mLs into the muscle once.   Preterm labor symptoms and general obstetric precautions including but not limited to vaginal bleeding, contractions, leaking of fluid and fetal movement were reviewed in detail with the patient. Please refer to After Visit Summary for other counseling recommendations.  No Follow-up on file.   Levie HeritageJacob J Stinson, DO

## 2016-06-12 NOTE — MAU Provider Note (Signed)
  History     CSN: [id number]  Arrival date and time: 06/12/16 1612   None     Chief Complaint  Patient presents with  . Non-stress Test   HPI Michelle Rice is 35 y.o. [phone] 3426w5d weeks presenting after being seen in the Clinic this afternoon.  Dr. Adrian BlackwaterStinson called concerned about an audible deceleration while being dopplered at her visit. Has gestational diabetes.   Hx of preterm labor.  She denies contractions, leaking of fluid and vaginal bleeding.  Past Medical History  Diagnosis Date  . Preterm labor     Past Surgical History  Procedure Laterality Date  . Cesarean section      Family History  Problem Relation Age of Onset  . Hypertension Father     Social History  Substance Use Topics  . Smoking status: Never Smoker   . Smokeless tobacco: Never Used  . Alcohol Use: No    Allergies: No Known Allergies  Facility-administered medications prior to admission  Medication Dose Route Frequency Provider Last Rate Last Dose  . hydroxyprogesterone caproate (MAKENA) 250 mg/mL injection 250 mg  250 mg Intramuscular Weekly Adam PhenixJames G Arnold, MD   250 mg at 06/12/16 1609   Prescriptions prior to admission  Medication Sig Dispense Refill Last Dose  . aspirin EC 81 MG tablet Take 1 tablet (81 mg total) by mouth daily. 30 tablet 6 06/11/2016 at Unknown time  . Prenatal Vit-Fe Fumarate-FA (PRENATAL MULTIVITAMIN) TABS tablet Take 1 tablet by mouth daily at 12 noon.   06/11/2016 at Unknown time    Review of Systems  Constitutional: Negative for fever and chills.  Eyes: Negative for blurred vision, double vision and photophobia.  Cardiovascular: Negative for chest pain.  Gastrointestinal: Negative for nausea, vomiting and abdominal pain.  Genitourinary: Negative for dysuria, urgency, frequency and hematuria.       Neg for vaginal bleeding or leaking of fluid.  Neurological: Negative for headaches.   Physical Exam   Blood pressure 115/61, pulse 62, temperature 98.2 F  (36.8 C), temperature source Oral, resp. rate 18, height 5\' 2"  (1.575 m), weight 184 lb 14 oz (83.859 kg), last menstrual period 10/23/2015, unknown if currently breastfeeding.  Physical Exam  Constitutional: She is oriented to person, place, and time. She appears well-developed and well-nourished. No distress.  Neurological: She is alert and oriented to person, place, and time.  Skin: Skin is warm and dry.  Psychiatric: She has a normal mood and affect. Her behavior is normal. Thought content normal.    MAU Course  Procedures  MDM MSE NST-reactive; Baseline 120-125 bpm, mod variability, accels.  No decels, No contractions. Hiccups Per Dr. Adrian BlackwaterStinson, if NST reactive may discharge to home.  If not, BPP.  It is reactive.  Will discharge to home.  Patient is expected call from the clinic to scheduled next appointment.  Instructed to return to MAU for pain, bleeding, loss of fluid, decreased fetal movement.   Assessment and Plan  A: Reactive NST at 7026w5d gestation.       Deceleration of heart tones noted by doppler  P:  Discharge to home      Patient states she was told someone will call her from the clinic to scheduled appt  KEY,EVE M 06/12/2016, 4:45 PM

## 2016-06-12 NOTE — Discharge Instructions (Signed)
Tercer trimestre de embarazo  (Third Trimester of Pregnancy)  El tercer trimestre comprende desde la semana 29 hasta la semana 42, es decir, desde el mes 7 hasta el mes 9. En este trimestre, el feto crece muy rápido. Hacia el final del noveno mes, el feto mide alrededor de 20 pulgadas (45 cm) de largo y pesa entre 6 y 10 libras (2,700 y 4,500 kg).   CUIDADOS EN EL HOGAR   · No fume, no consuma hierbas ni beba alcohol. No tome fármacos que el médico no haya autorizado.  · No consuma ningún producto que contenga tabaco, lo que incluye cigarrillos, tabaco de mascar o cigarrillos electrónicos. Si necesita ayuda para dejar de fumar, consulte al médico. Puede recibir asesoramiento u otro tipo de apoyo para dejar de fumar.  · Tome los medicamentos solamente como se lo haya indicado el médico. Algunos medicamentos son seguros para tomar durante el embarazo y otros no lo son.  · Haga ejercicios solamente como se lo haya indicado el médico. Interrumpa la actividad física si comienza a tener calambres.  · Ingiera alimentos saludables de manera regular.  · Use un sostén que le brinde buen soporte si sus mamas están sensibles.  · No se dé baños de inmersión en agua caliente, baños turcos ni saunas.  · Colóquese el cinturón de seguridad cuando conduzca.  · No coma carne cruda ni queso sin cocinar; evite el contacto con las bandejas sanitarias de los gatos y la tierra que estos animales usan.  · Tome las vitaminas prenatales.  · Tome entre 1500 y 2000 mg de calcio diariamente comenzando en la semana 20 del embarazo hasta el parto.  · Pruebe tomar un medicamento que la ayude a defecar (un laxante suave) si el médico lo autoriza. Consuma más fibra, que se encuentra en las frutas y verduras frescas y los cereales integrales. Beba suficiente líquido para mantener el pis (orina) claro o de color amarillo pálido.  · Dese baños de asiento con agua tibia para aliviar el dolor o las molestias causadas por las hemorroides. Use una crema  para las hemorroides si el médico la autoriza.  · Si se le hinchan las venas (venas varicosas), use medias de descanso. Levante (eleve) los pies durante 15 minutos, 3 o 4 veces por día. Limite el consumo de sal en su dieta.  · No levante objetos pesados, use zapatos de tacones bajos y siéntese derecha.  · Descanse con las piernas elevadas si tiene calambres o dolor de cintura.  · Visite a su dentista si no lo ha hecho durante el embarazo. Use un cepillo de cerdas suaves para cepillarse los dientes. Pásese el hilo dental con suavidad.  · Puede seguir manteniendo relaciones sexuales, a menos que el médico le indique lo contrario.  · No haga viajes de larga distancia, excepto si es obligatorio y solamente con la aprobación del médico.  · Tome clases prenatales.  · Practique ir manejando al hospital.  · Prepare el bolso que llevará al hospital.  · Prepare la habitación del bebé.  · Concurra a los controles médicos.  SOLICITE AYUDA SI:  · No está segura de si está en trabajo de parto o si ha roto la bolsa de las aguas.  · Tiene mareos.  · Siente calambres leves o presión en la parte inferior del abdomen.  · Sufre un dolor persistente en el abdomen.  · Tiene malestar estomacal (náuseas), vómitos, o tiene deposiciones acuosas (diarrea).  · Advierte un olor fétido que proviene de la vagina.  · Siente dolor al orinar.  SOLICITE AYUDA DE INMEDIATO SI:   ·   Tiene fiebre.  · Tiene una pérdida de líquido por la vagina.  · Tiene sangrado o pequeñas pérdidas vaginales.  · Siente dolor intenso o cólicos en el abdomen.  · Sube o baja de peso rápidamente.  · Tiene dificultades para recuperar el aliento y siente dolor en el pecho.  · Súbitamente se le hinchan mucho el rostro, las manos, los tobillos, los pies o las piernas.  · No ha sentido los movimientos del bebé durante una hora.  · Siente un dolor de cabeza intenso que no se alivia con medicamentos.  · Su visión se modifica.     Esta información no tiene como fin reemplazar el  consejo del médico. Asegúrese de hacerle al médico cualquier pregunta que tenga.     Document Released: 07/16/2013 Document Revised: 12/04/2014  Elsevier Interactive Patient Education ©2016 [employer]

## 2016-06-12 NOTE — MAU Note (Signed)
Patient sent from office at [redacted] weeks gestation for NST.

## 2016-06-19 ENCOUNTER — Ambulatory Visit: Payer: Self-pay | Admitting: *Deleted

## 2016-06-19 DIAGNOSIS — O09893 Supervision of other high risk pregnancies, third trimester: Secondary | ICD-10-CM

## 2016-06-19 DIAGNOSIS — O09213 Supervision of pregnancy with history of pre-term labor, third trimester: Principal | ICD-10-CM

## 2016-06-26 ENCOUNTER — Encounter: Payer: Self-pay | Admitting: Advanced Practice Midwife

## 2016-06-26 ENCOUNTER — Ambulatory Visit (INDEPENDENT_AMBULATORY_CARE_PROVIDER_SITE_OTHER): Payer: Self-pay | Admitting: Advanced Practice Midwife

## 2016-06-26 VITALS — BP 87/45 | HR 53 | Wt 186.8 lb

## 2016-06-26 DIAGNOSIS — O09893 Supervision of other high risk pregnancies, third trimester: Secondary | ICD-10-CM

## 2016-06-26 DIAGNOSIS — O24919 Unspecified diabetes mellitus in pregnancy, unspecified trimester: Secondary | ICD-10-CM

## 2016-06-26 DIAGNOSIS — O09213 Supervision of pregnancy with history of pre-term labor, third trimester: Secondary | ICD-10-CM

## 2016-06-26 DIAGNOSIS — O24419 Gestational diabetes mellitus in pregnancy, unspecified control: Secondary | ICD-10-CM

## 2016-06-26 LAB — POCT URINALYSIS DIP (DEVICE)
BILIRUBIN URINE: NEGATIVE
BILIRUBIN URINE: NEGATIVE
GLUCOSE, UA: NEGATIVE mg/dL
GLUCOSE, UA: NEGATIVE mg/dL
Hgb urine dipstick: NEGATIVE
Hgb urine dipstick: NEGATIVE
KETONES UR: NEGATIVE mg/dL
Ketones, ur: NEGATIVE mg/dL
LEUKOCYTES UA: NEGATIVE
Leukocytes, UA: NEGATIVE
NITRITE: NEGATIVE
Nitrite: NEGATIVE
PH: 5.5 (ref 5.0–8.0)
PROTEIN: NEGATIVE mg/dL
Protein, ur: NEGATIVE mg/dL
Specific Gravity, Urine: 1.015 (ref 1.005–1.030)
Specific Gravity, Urine: 1.015 (ref 1.005–1.030)
Urobilinogen, UA: 0.2 mg/dL (ref 0.0–1.0)
Urobilinogen, UA: 0.2 mg/dL (ref 0.0–1.0)
pH: 5 (ref 5.0–8.0)

## 2016-06-26 NOTE — Progress Notes (Signed)
Subjective:  Michelle Rice is a 35 y.o. [phone] at [redacted]w[redacted]d being seen today for ongoing prenatal care.  She is currently monitored for the following issues for this high-risk pregnancy and has Supervision of high risk pregnancy, antepartum; H/O preterm delivery, currently pregnant; Previous cesarean section complicating pregnancy, antepartum condition or complication; and Gestational diabetes mellitus (GDM) affecting pregnancy, antepartum (HCC) on her problem list.  Patient reports fatigue.   .  .  Movement: Present. Denies leaking of fluid.   The following portions of the patient's history were reviewed and updated as appropriate: allergies, current medications, past family history, past medical history, past social history, past surgical history and problem list. Problem list updated.  Objective:   Vitals:   06/26/16 1041  BP: (!) 87/45  Pulse: (!) 53  Weight: 186 lb 12.8 oz (84.7 kg)    Fetal Status:     Movement: Present     General:  Alert, oriented and cooperative. Patient is in no acute distress.  Skin: Skin is warm and dry. No rash noted.   Cardiovascular: Normal heart rate noted  Respiratory: Normal respiratory effort, no problems with respiration noted  Abdomen: Soft, gravid, appropriate for gestational age. Pain/Pressure: Present     Pelvic:  Cervical exam deferred        Extremities: Normal range of motion.     Mental Status: Normal mood and affect. Normal behavior. Normal judgment and thought content.   Urinalysis:      Assessment and Plan:  Pregnancy: [id number] at [redacted]w[redacted]d  1. History of preterm delivery, currently pregnant in third trimester --On 17-P this pregnancy, no s/sx of PTL today.  2. Gestational diabetes mellitus (GDM) affecting pregnancy, antepartum (HCC) -Reviewed glucose log (F: 64-80, PP: 77-114 with one at 145)  Preterm labor symptoms and general obstetric precautions including but not limited to vaginal bleeding, contractions, leaking of fluid  and fetal movement were reviewed in detail with the patient. Please refer to After Visit Summary for other counseling recommendations.  No Follow-up on file.   Hurshel Party, CNM

## 2016-06-26 NOTE — Patient Instructions (Signed)
Dieta con alto contenido de hierro (Iron-Rich Diet) El hierro es un mineral que ayuda al organismo a producir hemoglobina. La hemoglobina es una protena de los glbulos rojos que transporta el oxgeno a los tejidos del cuerpo. Consumir muy poco hierro [employer] se sienta dbil y [place], y aumentar su riesgo de contraer infecciones. Consumir la cantidad suficiente de hierro es necesario para el metabolismo del cuerpo, el funcionamiento muscular y el [place]. El hierro es un componente natural de muchos alimentos. Tambin puede agregarse a los alimentos, o bien los alimentos pueden fortificarse con hierro. Hay dos tipos de hierro de los alimentos:  Hierro hemo. El organismo absorbe el hierro hemo con mayor facilidad que el no hemo. La carne de res, de ave y de pescado contienen hierro hemo.  Hierro no hemo. Se encuentra en los complementos alimenticios, los cereales fortificados con hierro, los frijoles y las verduras. Es posible que deba seguir una dieta con alto contenido de hierro en los siguientes casos:  Si le han diagnosticado una deficiencia de hierro o anemia por deficiencia de hierro.  Si tiene una enfermedad que le impide absorber el hierro de los alimentos, por ejemplo:  Infecciones intestinales.  Enfermedad celaca. Esto incluye la inflamacin permanente (crnica) de los intestinos.  No consume suficiente hierro.  Consume una dieta que incluye muchos alimentos que afectan la absorcin de hierro.  Ha perdido [employer].  Tiene sangrado abundante cuando [place].  Est embarazada. EN QU CONSISTE EL PLAN? El mdico puede ayudarlo a Production assistant, radio cantidad de hierro que necesita a diario en funcin de su cuadro clnico. Por lo general, Adelene Amas persona consume cantidades suficientes de hierro en la dieta, se satisfacen las siguientes necesidades de hierro:  Hombres.  De 14 a 18aos:  al da.  De 19 a 50aos:  al da.  Mujeres.   De 14 a  18aos:  al Futures trader.  De 19 a 50aos:  al da.  Mayores de 50aos:  al da.  Embarazadas:  al da.  Mujeres en perodo de lactancia:  al da. QU DEBO SABER ACERCA DE LA DIETA CON ALTO CONTENIDO DE HIERRO?  Consuma frutas y verduras frescas con alto contenido de vitaminaC junto con alimentos ricos en hierro. Esto ayudar a aumentar la cantidad de hierro que el cuerpo absorbe de los alimentos, en especial, con los que contienen hierro no hemo. Entre los alimentos con alto contenido de vitaminaC se incluyen las [place], los pimientos morrones, los tomates y Social worker.  Tome los suplementos de hierro solamente como se lo haya indicado el mdico. La sobredosis de hierro puede ser potencialmente mortal. Si le recetan suplementos de hierro, tmelos con jugo de naranja o un suplemento de vitaminaC.  Cocine los alimentos en ollas de hierro.   Consuma alimentos que contengan hierro no hemo junto con aquellos con alto contenido de hierro hemo. Esto ayuda a mejorar la absorcin de hierro.   Determinados alimentos y ciertas bebidas contienen compuestos que afectan la absorcin de hierro. No consuma estos alimentos en la misma comida que aquellos con alto contenido de hierro o con suplementos de Company secretary. Estos incluyen los siguientes:  Caf, t negro y vino tinto.  Leche, productos lcteos y alimentos con alto contenido de calcio.  Frijoles, porotos de soja y [place].  Cereales integrales.  Cuando consuma alimentos que contengan hierro no hemo y compuestos que afecten la absorcin de hierro, siga estos consejos para mejorar la absorcin de hierro.   Antes de cocinarlos, remoje  los frijoles durante la noche.  Antes de usarlos, remoje los cereales integrales durante la noche y culelos.  Prepare un fermento con las harinas antes del horneado, como si usara levadura en la masa del pan. QU ALIMENTOS PUEDO COMER? Cereales Cereales para el desayuno fortificados con  hierro. Pan de trigo integral fortificado con hierro. Arroz enriquecido. Granos germinados. Verduras [place]. Papas con cscara. Guisantes. Brcoli. Pimientos morrones rojos y verdes. Verduras fermentadas. Frutas Ciruelas pasas. Pasas. Naranjas. Jinny Sanders. Mango. Pomelo. Carnes y otras fuentes de protenas Hgado de res. Ostras. Carne de vaca. Camarones. Pavo. Pollo. Atn. Sardinas. Garbanzos. Nueces. Tofu. Bebidas Jugo de tomate. Jugo de naranja recin exprimido. Jugo de ciruelas. T de hibisco. Batidos instantneos fortificados para el desayuno. Condimentos Tahini. Salsa de soja fermentada. Dulces y Statistician.  Otros Germen de trigo. Los artculos mencionados arriba pueden no ser [employer] de las bebidas o los alimentos recomendados. Comunquese con el nutricionista para conocer ms opciones. QU ALIMENTOS NO SE RECOMIENDAN? Cereales Cereales integrales. Cereal de salvado. Harina de salvado. Avena. Verduras [place]. Repollitos de Bruselas. Col rizada. Nils Pyle Arndanos. Blanchie Serve. Jinny Sanders. Higos. Carnes y otras fuentes de protenas Soja. Productos elaborados a base de protena de la soja. Lcteos Leche. Crema. Queso. Yogur. [employer]. Bebidas Caf. T negro. Vino tinto. Dulces y [employer]. Chocolate. Helados. Otros [place]. Organo. Perejil. Los artculos mencionados arriba pueden no ser [employer] de las bebidas y los alimentos que se Theatre stage manager. Comunquese con el nutricionista para obtener ms informacin.   Esta informacin no tiene Theme park manager el consejo del mdico. Asegrese de hacerle al mdico cualquier pregunta que tenga.   Document Released: 08/30/2006 Document Revised: 12/04/2014 Elsevier Interactive Patient Education [employer]

## 2016-07-03 ENCOUNTER — Ambulatory Visit (INDEPENDENT_AMBULATORY_CARE_PROVIDER_SITE_OTHER): Payer: Medicaid Other | Admitting: General Practice

## 2016-07-03 VITALS — BP 110/69 | HR 54 | Ht 62.0 in | Wt 187.0 lb

## 2016-07-03 DIAGNOSIS — O09213 Supervision of pregnancy with history of pre-term labor, third trimester: Secondary | ICD-10-CM

## 2016-07-03 DIAGNOSIS — O09899 Supervision of other high risk pregnancies, unspecified trimester: Secondary | ICD-10-CM

## 2016-07-03 DIAGNOSIS — O09219 Supervision of pregnancy with history of pre-term labor, unspecified trimester: Principal | ICD-10-CM

## 2016-07-10 ENCOUNTER — Ambulatory Visit (INDEPENDENT_AMBULATORY_CARE_PROVIDER_SITE_OTHER): Payer: Medicaid Other | Admitting: Family Medicine

## 2016-07-10 VITALS — BP 101/59 | HR 52 | Wt 190.7 lb

## 2016-07-10 DIAGNOSIS — O24419 Gestational diabetes mellitus in pregnancy, unspecified control: Secondary | ICD-10-CM

## 2016-07-10 DIAGNOSIS — O0993 Supervision of high risk pregnancy, unspecified, third trimester: Secondary | ICD-10-CM

## 2016-07-10 DIAGNOSIS — O24919 Unspecified diabetes mellitus in pregnancy, unspecified trimester: Secondary | ICD-10-CM

## 2016-07-10 DIAGNOSIS — O34219 Maternal care for unspecified type scar from previous cesarean delivery: Secondary | ICD-10-CM

## 2016-07-10 DIAGNOSIS — O09893 Supervision of other high risk pregnancies, third trimester: Secondary | ICD-10-CM

## 2016-07-10 DIAGNOSIS — O09213 Supervision of pregnancy with history of pre-term labor, third trimester: Secondary | ICD-10-CM

## 2016-07-10 LAB — POCT URINALYSIS DIP (DEVICE)
Bilirubin Urine: NEGATIVE
Glucose, UA: NEGATIVE mg/dL
Hgb urine dipstick: NEGATIVE
KETONES UR: NEGATIVE mg/dL
Leukocytes, UA: NEGATIVE
Nitrite: NEGATIVE
PH: 6 (ref 5.0–8.0)
PROTEIN: NEGATIVE mg/dL
SPECIFIC GRAVITY, URINE: 1.01 (ref 1.005–1.030)
Urobilinogen, UA: 0.2 mg/dL (ref 0.0–1.0)

## 2016-07-10 NOTE — Progress Notes (Signed)
Subjective:  Michelle Rice is a 35 y.o. [phone] at 4480w5d being seen today for ongoing prenatal care.  She is currently monitored for the following issues for this high-risk pregnancy and has Supervision of high risk pregnancy, antepartum; H/O preterm delivery, currently pregnant; Previous cesarean section complicating pregnancy, antepartum condition or complication; and Gestational diabetes mellitus (GDM) affecting pregnancy, antepartum (HCC) on her problem list.  Patient reports no complaints.  Contractions: Not present.  .  Movement: Present. Denies leaking of fluid.   The following portions of the patient's history were reviewed and updated as appropriate: allergies, current medications, past family history, past medical history, past social history, past surgical history and problem list. Problem list updated.  Objective:   Vitals:   07/10/16 1610  BP: (!) 101/59  Pulse: (!) 52  Weight: 190 lb 11.2 oz (86.5 kg)    Fetal Status: Fetal Heart Rate (bpm): 151   Movement: Present     General:  Alert, oriented and cooperative. Patient is in no acute distress.  Skin: Skin is warm and dry. No rash noted.   Cardiovascular: Normal heart rate noted  Respiratory: Normal respiratory effort, no problems with respiration noted  Abdomen: Soft, gravid, appropriate for gestational age. Pain/Pressure: Present     Pelvic:  Cervical exam deferred        Extremities: Normal range of motion.  Edema: None  Mental Status: Normal mood and affect. Normal behavior. Normal judgment and thought content.   Urinalysis:      Assessment and Plan:  Pregnancy: [id number] at 7080w5d  1. Supervision of high risk pregnancy, antepartum, third trimester - desires BTL if has cesarean, self pay  2. Gestational diabetes mellitus (GDM) affecting pregnancy, antepartum (HCC) - Reviewed log book, FBS <95, 2-hr PP: <120, very well controlled  3. Previous cesarean section complicating pregnancy, antepartum condition  or complication  4. H/O preterm delivery, currently pregnant, third trimester   Preterm labor symptoms and general obstetric precautions including but not limited to vaginal bleeding, contractions, leaking of fluid and fetal movement were reviewed in detail with the patient. Please refer to After Visit Summary for other counseling recommendations.  Return in about 1 week (around 07/17/2016) for Routine OB visit.   [facility] CenterElizabeth Woodland Rice, [place]DO

## 2016-07-10 NOTE — Patient Instructions (Signed)
Tercer trimestre de embarazo (Third Trimester of Pregnancy) El tercer trimestre comprende desde la semana29 hasta la semana42, es decir, desde el mes7 hasta el mes9. El tercer trimestre es un perodo en el que el feto crece rpidamente. Hacia el final del noveno mes, el feto mide alrededor de 20pulgadas (45cm) de largo y pesa entre 6 y 10 libras (2,700 y 4,500kg).  CAMBIOS EN EL ORGANISMO Su organismo atraviesa por muchos cambios durante el embarazo, y estos varan de una mujer a otra.   Seguir aumentando de peso. Es de esperar que aumente entre 25 y 35libras (11 y 16kg) hacia el final del embarazo.  Podrn aparecer las primeras estras en las caderas, el abdomen y las mamas.  Puede tener necesidad de orinar con ms frecuencia porque el feto baja hacia la pelvis y ejerce presin sobre la vejiga.  Debido al embarazo podr sentir acidez estomacal con frecuencia.  Puede estar estreida, ya que ciertas hormonas enlentecen los movimientos de los msculos que empujan los desechos a travs de los intestinos.  Pueden aparecer hemorroides o abultarse e hincharse las venas (venas varicosas).  Puede sentir dolor plvico debido al aumento de peso y a que las hormonas del embarazo relajan las articulaciones entre los huesos de la pelvis. El dolor de espalda puede ser consecuencia de la sobrecarga de los msculos que soportan la postura.  Tal vez haya cambios en el cabello que pueden incluir su engrosamiento, crecimiento rpido y cambios en la textura. Adems, a algunas mujeres se les cae el cabello durante o despus del embarazo, o tienen el cabello seco o fino. Lo ms probable es que el cabello se le normalice despus del nacimiento del beb.  Las mamas seguirn creciendo y le dolern. A veces, puede haber una secrecin amarilla de las mamas llamada calostro.  El ombligo puede salir hacia afuera.  Puede sentir que le falta el aire debido a que se expande el tero.  Puede notar que el feto  "baja" o lo siente ms bajo, en el abdomen.  Puede tener una prdida de secrecin mucosa con sangre. Esto suele ocurrir en el trmino de unos pocos das a una semana antes de que comience el trabajo de parto.  El cuello del tero se vuelve delgado y blando (se borra) cerca de la fecha de parto. QU DEBE ESPERAR EN LOS EXMENES PRENATALES  Le harn exmenes prenatales cada 2semanas hasta la semana36. A partir de ese momento le harn exmenes semanales. Durante una visita prenatal de rutina:  La pesarn para asegurarse de que usted y el feto estn creciendo normalmente.  Le tomarn la presin arterial.  Le medirn el abdomen para controlar el desarrollo del beb.  Se escucharn los latidos cardacos fetales.  Se evaluarn los resultados de los estudios solicitados en visitas anteriores.  Le revisarn el cuello del tero cuando est prxima la fecha de parto para controlar si este se ha borrado. Alrededor de la semana36, el mdico le revisar el cuello del tero. Al mismo tiempo, realizar un anlisis de las secreciones del tejido vaginal. Este examen es para determinar si hay un tipo de bacteria, estreptococo Grupo B. El mdico le explicar esto con ms detalle. El mdico puede preguntarle lo siguiente:  Cmo le gustara que fuera el parto.  Cmo se siente.  Si siente los movimientos del beb.  Si ha tenido sntomas anormales, como prdida de lquido, sangrado, dolores de cabeza intensos o clicos abdominales.  Si est consumiendo algn producto que contenga tabaco, como cigarrillos, tabaco   de mascar y cigarrillos electrnicos.  Si tiene alguna pregunta. Otros exmenes o estudios de deteccin que pueden realizarse durante el tercer trimestre incluyen lo siguiente:  Anlisis de sangre para controlar los niveles de hierro (anemia).  Controles fetales para determinar su salud, nivel de actividad y crecimiento. Si tiene alguna enfermedad o hay problemas durante el embarazo, le harn  estudios.  Prueba del VIH (virus de inmunodeficiencia humana). Si corre un riesgo alto, pueden realizarle una prueba de deteccin del VIH durante el tercer trimestre del embarazo. FALSO TRABAJO DE PARTO Es posible que sienta contracciones leves e irregulares que finalmente desaparecen. Se llaman contracciones de Braxton Hicks o falso trabajo de parto. Las contracciones pueden durar horas, das o incluso semanas, antes de que el verdadero trabajo de parto se inicie. Si las contracciones ocurren a intervalos regulares, se intensifican o se hacen dolorosas, lo mejor es que la revise el mdico.  SIGNOS DE TRABAJO DE PARTO   Clicos de tipo menstrual.  Contracciones cada 5minutos o menos.  Contracciones que comienzan en la parte superior del tero y se extienden hacia abajo, a la zona inferior del abdomen y la espalda.  Sensacin de mayor presin en la pelvis o dolor de espalda.  Una secrecin de mucosidad acuosa o con sangre que sale de la vagina. Si tiene alguno de estos signos antes de la semana37 del embarazo, llame a su mdico de inmediato. Debe concurrir al hospital para que la controlen inmediatamente. INSTRUCCIONES PARA EL CUIDADO EN EL HOGAR   Evite fumar, consumir hierbas, beber alcohol y tomar frmacos que no le hayan recetado. Estas sustancias qumicas afectan la formacin y el desarrollo del beb.  No consuma ningn producto que contenga tabaco, lo que incluye cigarrillos, tabaco de mascar y cigarrillos electrnicos. Si necesita ayuda para dejar de fumar, consulte al mdico. Puede recibir asesoramiento y otro tipo de recursos para dejar de fumar.  Siga las indicaciones del mdico en relacin con el uso de medicamentos. Durante el embarazo, hay medicamentos que son seguros de tomar y otros que no.  Haga ejercicio solamente como se lo haya indicado el mdico. Sentir clicos uterinos es un buen signo para detener la actividad fsica.  Contine comiendo alimentos sanos con  regularidad.  Use un sostn que le brinde buen soporte si le duelen las mamas.  No se d baos de inmersin en agua caliente, baos turcos ni saunas.  Use el cinturn de seguridad en todo momento mientras conduce.  No coma carne cruda ni queso sin cocinar; evite el contacto con las bandejas sanitarias de los gatos y la tierra que estos animales usan. Estos elementos contienen grmenes que pueden causar defectos congnitos en el beb.  Tome las vitaminas prenatales.  Tome entre 1500 y 2000mg de calcio diariamente comenzando en la semana20 del embarazo hasta el parto.  Si est estreida, pruebe un laxante suave (si el mdico lo autoriza). Consuma ms alimentos ricos en fibra, como vegetales y frutas frescos y cereales integrales. Beba gran cantidad de lquido para mantener la orina de tono claro o color amarillo plido.  Dese baos de asiento con agua tibia para aliviar el dolor o las molestias causadas por las hemorroides. Use una crema para las hemorroides si el mdico la autoriza.  Si tiene venas varicosas, use medias de descanso. Eleve los pies durante 15minutos, 3 o 4veces por da. Limite el consumo de sal en su dieta.  Evite levantar objetos pesados, use zapatos de tacones bajos y mantenga una buena postura.  Descanse   con las piernas elevadas si tiene calambres o dolor de cintura.  Visite a su dentista si no lo ha hecho durante el embarazo. Use un cepillo de dientes blando para higienizarse los dientes y psese el hilo dental con suavidad.  Puede seguir manteniendo relaciones sexuales, a menos que el mdico le indique lo contrario.  No haga viajes largos excepto que sea absolutamente necesario y solo con la autorizacin del mdico.  Tome clases prenatales para entender, practicar y hacer preguntas sobre el trabajo de parto y el parto.  Haga un ensayo de la partida al hospital.  Prepare el bolso que llevar al hospital.  Prepare la habitacin del beb.  Concurra a todas  las visitas prenatales segn las indicaciones de su mdico. SOLICITE ATENCIN MDICA SI:  No est segura de que est en trabajo de parto o de que ha roto la bolsa de las aguas.  Tiene mareos.  Siente clicos leves, presin en la pelvis o dolor persistente en el abdomen.  Tiene nuseas, vmitos o diarrea persistentes.  Observa una secrecin vaginal con mal olor.  Siente dolor al orinar. SOLICITE ATENCIN MDICA DE INMEDIATO SI:   Tiene fiebre.  Tiene una prdida de lquido por la vagina.  Tiene sangrado o pequeas prdidas vaginales.  Siente dolor intenso o clicos en el abdomen.  Sube o baja de peso rpidamente.  Tiene dificultad para respirar y siente dolor de pecho.  Sbitamente se le hinchan mucho el rostro, las manos, los tobillos, los pies o las piernas.  No ha sentido los movimientos del beb durante una hora.  Siente un dolor de cabeza intenso que no se alivia con medicamentos.  Su visin se modifica.   Esta informacin no tiene como fin reemplazar el consejo del mdico. Asegrese de hacerle al mdico cualquier pregunta que tenga.   Document Released: 08/23/2005 Document Revised: 12/04/2014 Elsevier Interactive Patient Education 2016 Elsevier Inc.  

## 2016-07-18 ENCOUNTER — Inpatient Hospital Stay (HOSPITAL_COMMUNITY)
Admission: AD | Admit: 2016-07-18 | Discharge: 2016-07-20 | DRG: 775 | Disposition: A | Discharge status: Home / Self Care | Payer: Medicaid Other | Source: Ambulatory Visit | Attending: Family Medicine | Admitting: Family Medicine

## 2016-07-18 DIAGNOSIS — O0993 Supervision of high risk pregnancy, unspecified, third trimester: Secondary | ICD-10-CM

## 2016-07-18 DIAGNOSIS — Z8249 Family history of ischemic heart disease and other diseases of the circulatory system: Secondary | ICD-10-CM | POA: Diagnosis not present

## 2016-07-18 DIAGNOSIS — O34211 Maternal care for low transverse scar from previous cesarean delivery: Secondary | ICD-10-CM | POA: Diagnosis present

## 2016-07-18 DIAGNOSIS — O24420 Gestational diabetes mellitus in childbirth, diet controlled: Secondary | ICD-10-CM | POA: Diagnosis present

## 2016-07-18 DIAGNOSIS — Z7982 Long term (current) use of aspirin: Secondary | ICD-10-CM

## 2016-07-18 DIAGNOSIS — O24419 Gestational diabetes mellitus in pregnancy, unspecified control: Secondary | ICD-10-CM

## 2016-07-18 DIAGNOSIS — Z3A36 36 weeks gestation of pregnancy: Secondary | ICD-10-CM

## 2016-07-18 DIAGNOSIS — IMO0001 Reserved for inherently not codable concepts without codable children: Secondary | ICD-10-CM

## 2016-07-18 DIAGNOSIS — O24429 Gestational diabetes mellitus in childbirth, unspecified control: Secondary | ICD-10-CM

## 2016-07-18 DIAGNOSIS — O09893 Supervision of other high risk pregnancies, third trimester: Secondary | ICD-10-CM

## 2016-07-18 DIAGNOSIS — O34219 Maternal care for unspecified type scar from previous cesarean delivery: Secondary | ICD-10-CM

## 2016-07-18 DIAGNOSIS — O09213 Supervision of pregnancy with history of pre-term labor, third trimester: Secondary | ICD-10-CM

## 2016-07-18 LAB — RPR: RPR: NONREACTIVE

## 2016-07-18 LAB — CBC
HEMATOCRIT: 34.7 % — AB (ref 36.0–46.0)
Hemoglobin: 12.1 g/dL (ref 12.0–15.0)
MCH: 27.9 pg (ref 26.0–34.0)
MCHC: 34.9 g/dL (ref 30.0–36.0)
MCV: 80.1 fL (ref 78.0–100.0)
PLATELETS: 216 10*3/uL (ref 150–400)
RBC: 4.33 MIL/uL (ref 3.87–5.11)
RDW: 14.3 % (ref 11.5–15.5)
WBC: 16.7 10*3/uL — AB (ref 4.0–10.5)

## 2016-07-18 LAB — TYPE AND SCREEN
ABO/RH(D): B POS
Antibody Screen: NEGATIVE

## 2016-07-18 MED ORDER — ACETAMINOPHEN 325 MG PO TABS: 650.0000 mg | ORAL_TABLET | ORAL | Status: DC | PRN

## 2016-07-18 MED ORDER — OXYCODONE-ACETAMINOPHEN 5-325 MG PO TABS: 1.0000 | ORAL_TABLET | ORAL | Status: DC | PRN

## 2016-07-18 MED ORDER — SODIUM CHLORIDE 0.9% FLUSH
3.0000 mL | Freq: Two times a day (BID) | INTRAVENOUS | Status: DC
Start: 2016-07-18 — End: 2016-07-20
  Administered 2016-07-19: 3 mL via INTRAVENOUS

## 2016-07-18 MED ORDER — LACTATED RINGERS IV SOLN: 500.0000 mL | INTRAVENOUS | Status: DC | PRN

## 2016-07-18 MED ORDER — SENNOSIDES-DOCUSATE SODIUM 8.6-50 MG PO TABS
2.0000 | ORAL_TABLET | ORAL | Status: DC
  Administered 2016-07-19 (×2): 2 via ORAL
  Filled 2016-07-18 (×2): qty 2

## 2016-07-18 MED ORDER — ONDANSETRON HCL 4 MG PO TABS: 4.0000 mg | ORAL_TABLET | ORAL | Status: DC | PRN

## 2016-07-18 MED ORDER — IBUPROFEN 600 MG PO TABS
600.0000 mg | ORAL_TABLET | Freq: Four times a day (QID) | ORAL | Status: DC
  Administered 2016-07-18 – 2016-07-20 (×9): 600 mg via ORAL
  Filled 2016-07-18 (×10): qty 1

## 2016-07-18 MED ORDER — OXYTOCIN 40 UNITS IN LACTATED RINGERS INFUSION - SIMPLE MED: 2.5000 [IU]/h | INTRAVENOUS | Status: DC

## 2016-07-18 MED ORDER — PRENATAL MULTIVITAMIN CH
1.0000 | ORAL_TABLET | Freq: Every day | ORAL | Status: DC
  Administered 2016-07-18 – 2016-07-20 (×3): 1 via ORAL
  Filled 2016-07-18 (×3): qty 1

## 2016-07-18 MED ORDER — OXYTOCIN 10 UNIT/ML IJ SOLN
INTRAMUSCULAR | Status: AC
  Administered 2016-07-18: 10 [IU] via INTRAMUSCULAR
  Filled 2016-07-18: qty 1

## 2016-07-18 MED ORDER — OXYTOCIN 40 UNITS IN LACTATED RINGERS INFUSION - SIMPLE MED: 2.5000 [IU]/h | INTRAVENOUS | Status: DC | PRN

## 2016-07-18 MED ORDER — SODIUM CHLORIDE 0.9 % IV SOLN: 250.0000 mL | INTRAVENOUS | Status: DC | PRN

## 2016-07-18 MED ORDER — COCONUT OIL OIL: 1.0000 "application " | TOPICAL_OIL | Status: DC | PRN

## 2016-07-18 MED ORDER — LACTATED RINGERS IV SOLN
INTRAVENOUS | Status: DC
  Administered 2016-07-18: 04:00:00 via INTRAVENOUS

## 2016-07-18 MED ORDER — LIDOCAINE HCL (PF) 1 % IJ SOLN
30.0000 mL | INTRAMUSCULAR | Status: DC | PRN
  Filled 2016-07-18: qty 30

## 2016-07-18 MED ORDER — OXYTOCIN 10 UNIT/ML IJ SOLN
10.0000 [IU] | Freq: Once | INTRAMUSCULAR | Status: AC
  Administered 2016-07-18: 10 [IU] via INTRAMUSCULAR

## 2016-07-18 MED ORDER — OXYTOCIN BOLUS FROM INFUSION: 500.0000 mL | Freq: Once | INTRAVENOUS | Status: DC

## 2016-07-18 MED ORDER — TETANUS-DIPHTH-ACELL PERTUSSIS 5-2.5-18.5 LF-MCG/0.5 IM SUSP: 0.5000 mL | Freq: Once | INTRAMUSCULAR | Status: DC

## 2016-07-18 MED ORDER — FLEET ENEMA 7-19 GM/118ML RE ENEM: 1.0000 | ENEMA | RECTAL | Status: DC | PRN

## 2016-07-18 MED ORDER — SIMETHICONE 80 MG PO CHEW: 80.0000 mg | CHEWABLE_TABLET | ORAL | Status: DC | PRN

## 2016-07-18 MED ORDER — DIPHENHYDRAMINE HCL 25 MG PO CAPS: 25.0000 mg | ORAL_CAPSULE | Freq: Four times a day (QID) | ORAL | Status: DC | PRN

## 2016-07-18 MED ORDER — WITCH HAZEL-GLYCERIN EX PADS: 1.0000 "application " | MEDICATED_PAD | CUTANEOUS | Status: DC | PRN

## 2016-07-18 MED ORDER — ZOLPIDEM TARTRATE 5 MG PO TABS: 5.0000 mg | ORAL_TABLET | Freq: Every evening | ORAL | Status: DC | PRN

## 2016-07-18 MED ORDER — SOD CITRATE-CITRIC ACID 500-334 MG/5ML PO SOLN: 30.0000 mL | ORAL | Status: DC | PRN

## 2016-07-18 MED ORDER — SODIUM CHLORIDE 0.9% FLUSH: 3.0000 mL | INTRAVENOUS | Status: DC | PRN

## 2016-07-18 MED ORDER — BENZOCAINE-MENTHOL 20-0.5 % EX AERO: 1.0000 "application " | INHALATION_SPRAY | CUTANEOUS | Status: DC | PRN

## 2016-07-18 MED ORDER — ONDANSETRON HCL 4 MG/2ML IJ SOLN: 4.0000 mg | Freq: Four times a day (QID) | INTRAMUSCULAR | Status: DC | PRN

## 2016-07-18 MED ORDER — OXYCODONE-ACETAMINOPHEN 5-325 MG PO TABS: 2.0000 | ORAL_TABLET | ORAL | Status: DC | PRN

## 2016-07-18 MED ORDER — DIBUCAINE 1 % RE OINT: 1.0000 "application " | TOPICAL_OINTMENT | RECTAL | Status: DC | PRN

## 2016-07-18 MED ORDER — ONDANSETRON HCL 4 MG/2ML IJ SOLN: 4.0000 mg | INTRAMUSCULAR | Status: DC | PRN

## 2016-07-18 NOTE — Lactation Note (Signed)
This note was copied from a baby's chart. Lactation Consultation Note  Patient Name: Michelle Rice MVHQI'OToday's Date: 07/18/2016 Reason for consult: Initial assessment Baby at 13 hr of life. Mom reports baby is sleepy and not latching for long. She has a DEBP set up and has expressed colostrum in the bedside table with a slow flow nipple. Discussed the risks of artificial nipples. Given curved tip syring so that she can get the milk out of the bottle and spoon feed baby. She bf her oldest 2 m because he was in the NICU and bf her other 2 for 2.5 years. Discussed baby behavior, feeding frequency, baby belly size, voids, wt loss, breast changes, and nipple care. She can manually express and has spoon in room.Given lactation handouts. Aware of OP services and support group.    Maternal Data Has patient been taught Hand Expression?: Yes Does the patient have breastfeeding experience prior to this delivery?: Yes  Feeding Feeding Type: Breast Fed Length of feed: 20 min  LATCH Score/Interventions                      Lactation Tools Discussed/Used WIC Program: Yes   Consult Status Consult Status: Follow-up Date: 07/19/16    [name] 07/18/2016, 3:42 PM

## 2016-07-18 NOTE — H&P (Signed)
LABOR AND DELIVERY ADMISSION HISTORY AND PHYSICAL NOTE  Michelle Rice is a 35 y.o. female [phone] with IUP at 7047w6d by US presenting for Spontaneous onset of labor, precipitous delivery.   She reports positive fetal movement. She denies leakage of fluid or vaginal bleeding.  Prenatal History/Complications:  Past Medical History: Past Medical History:  Diagnosis Date  . Preterm labor     Past Surgical History: Past Surgical History:  Procedure Laterality Date  . CESAREAN SECTION      Obstetrical History: OB History    Gravida Para Term Preterm AB Living   5 3 1 2 1 3    SAB TAB Ectopic Multiple Live Births   1 0 0 0 3      Social History: Social History   Social History  . Marital status: Single    Spouse name: N/A  . Number of children: N/A  . Years of education: N/A   Social History Main Topics  . Smoking status: Never Smoker  . Smokeless tobacco: Never Used  . Alcohol use No  . Drug use: No  . Sexual activity: Not Currently    Birth control/ protection: None   Other Topics Concern  . Not on file   Social History Narrative  . No narrative on file    Family History: Family History  Problem Relation Age of Onset  . Hypertension Father     Allergies: No Known Allergies  Prescriptions Prior to Admission  Medication Sig Dispense Refill Last Dose  . aspirin EC 81 MG tablet Take 1 tablet (81 mg total) by mouth daily. 30 tablet 6 Taking  . Prenatal Vit-Fe Fumarate-FA (PRENATAL MULTIVITAMIN) TABS tablet Take 1 tablet by mouth daily at 12 noon.   Taking     Review of Systems   All systems reviewed and negative except as stated in HPI  Blood pressure 134/83, pulse 72, temperature 98.3 F (36.8 C), temperature source Oral, resp. rate 18, last menstrual period 10/23/2015, unknown if currently breastfeeding. General appearance: alert, cooperative and appears stated age Lungs: clear to auscultation bilaterally Heart: regular rate and  rhythm Abdomen: soft, non-tender; bowel sounds normal Extremities: No calf swelling or tenderness Presentation: cephalic Fetal monitoring: 125, mod var, +Accels, - decels Uterine activity: q2 min Dilation: 10 Effacement (%): 100 Station: +2 Exam by:: Michelle Rice RNC   Prenatal labs: ABO, Rh: B/Positive/-- (03/30 0000) Antibody: Negative (03/30 0000) Rubella: !Error! RPR: NON REAC (06/26 1319)  HBsAg: Negative (03/30 0000)  HIV: NONREACTIVE (06/26 1319)  GBS:    1 hr Glucola: 192 (early);  123 Genetic screening:  NEG Anatomy US: Normal, female  Prenatal Transfer Tool  Maternal Diabetes: Yes:  Diabetes Type:  Diet controlled Genetic Screening: Normal Maternal Ultrasounds/Referrals: Normal Fetal Ultrasounds or other Referrals:  None Maternal Substance Abuse:  No Significant Maternal Medications:  Meds include: Other: ASA Significant Maternal Lab Results: None  No results found for this or any previous visit (from the past 24 hour(s)).  Patient Active Problem List   Diagnosis Date Noted  . Previous cesarean section complicating pregnancy, antepartum condition or complication 03/20/2016  . Gestational diabetes mellitus (GDM) affecting pregnancy, antepartum (HCC) 03/20/2016  . Supervision of high risk pregnancy, antepartum 03/04/2016  . H/O preterm delivery, currently pregnant 03/04/2016    Assessment: Michelle Rice is a 35 y.o. [phone] at 2347w6d here for SOL, precipitous delivery in MAU.  #Labor: VBAC in MAU #Pain: Natural #FWB: Cat I #ID:  GBS Unknown, precipitous delivery #MOF: Breast #MOC:Desires PPBTL #Circ:  Michelle Michelle Rice  Michelle Chao [place], DO 07/18/2016, 3:44 AM

## 2016-07-18 NOTE — Progress Notes (Signed)
Patient transferred to my care from MAU into room 112. With the interpreter present I oriented her to the room, discussed the plan of care and booklet. We also discussed the infant safety paper. Patient states she is not in any pain at this time, verbalizes that she will not get out of bed without assistance.

## 2016-07-18 NOTE — Progress Notes (Signed)
Patient ambulated out of bed with minimal to no assistance. Voided an adequate amount of clear yellow urine, and independently gave pericare. She walked back to bed with no assistance. Tolerated very well.

## 2016-07-18 NOTE — Progress Notes (Signed)
Notified of pt arrival in MAU and exam. Scheduled for c/s because of desired tubal ligation. Will come talk to patient about vaginal delivery.

## 2016-07-18 NOTE — MAU Note (Signed)
Pt presents complaining of worsening contractions. Denies leaking or bleeding. Reports good fetal movement.

## 2016-07-19 ENCOUNTER — Encounter (HOSPITAL_COMMUNITY): Payer: Self-pay

## 2016-07-19 LAB — GLUCOSE, CAPILLARY: GLUCOSE-CAPILLARY: 75 mg/dL (ref 65–99)

## 2016-07-19 NOTE — Progress Notes (Signed)
Post Partum Day 1  Subjective:  [name] is a 35 y.o. [id number] 7377w0d s/p SVD.  No acute events overnight.  Pt denies problems with ambulating, voiding or po intake.  She denies nausea or vomiting.  Pain is well controlled.  She has had flatus. She has not had bowel movement.  Lochia Minimal.  Plan for birth control is Undecided .  Method of Feeding: Breast  Objective: BP 109/72 (BP Location: Left Arm)   Pulse 61   Temp 98.3 F (36.8 C) (Oral)   Resp 16   Ht 5\' 2"  (1.575 m)   LMP 10/23/2015 (Exact Date)   Physical Exam:  General: alert, cooperative and no distress Lochia:normal flow Chest: CTAB Heart: RRR no m/r/g Abdomen: +BS, soft, nontender, fundus firm at/below umbilicus Uterine Fundus: firm,  DVT Evaluation: No evidence of DVT seen on physical exam. Extremities: no edema   Recent Labs  07/18/16 0624  HGB 12.1  HCT 34.7*    Assessment/Plan:  ASSESSMENT: [name] is a 35 y.o. [id number] 6777w0d ppd #1 s/p NSVD/VAVD/FAVD doing well.   GDMA1 - FBS 75, needs follow up in 6 weeks for her diabetes  Plan for discharge tomorrow   LOS: 1 day   Tomothy Eddins Z Lilja Soland 07/19/2016, 7:41 AM

## 2016-07-19 NOTE — Lactation Note (Signed)
This note was copied from a baby's chart. Lactation Consultation Note  Patient Name: Michelle Rice MVHQI'Naoma DienerOToday's Date: 07/19/2016 Reason for consult: Follow-up assessment;Infant < 6lbs;Late preterm infant   Follow up with Exp BF mom of 42 hour old infant. Spoke with mother with assistance of Slippery RockMarta, hospital interpreter.  Infant with 4 BF for 10-35 minutes, 1 attempt, 6 bottle feeds of formula of 9-25 cc, EBM x 1 of 2 cc via bottle, I stool and 5 voids in 24 hours preceding this assessment. LATCH Scores 5-8 by bedside RN's. Infant weight 5 lb 11.5 oz with weight loss of 4% since birth.  Mom reports she is BF infant each time he cues to feed for 10 minutes on each side and then offering supplement. She reports she is pumping about every 3 hours and getting about 3-4 cc EBM which she is feeding to infant and then following with formula. She reports she was informed to increase supplement today and she has done so. Mom reports pain with initial latch that improves with feeding, enc her to use EBM to nipples post BF, she voiced understanding.   Mom was asking when his next bilirubin is, told her it was drawn at 1438 and it is usually 12-24 hours later that it is checked again. Mom without questions/concerns at this time. Enc her to call with questions/concerns prn.    Maternal Data Formula Feeding for Exclusion: No Does the patient have breastfeeding experience prior to this delivery?: Yes  Feeding Feeding Type: Formula  LATCH Score/Interventions                      Lactation Tools Discussed/Used Pump Review: Setup, frequency, and cleaning;Milk Storage   Consult Status Consult Status: Follow-up Date: 07/20/16 Follow-up type: In-patient    Silas FloodSharon S Tran Arzuaga 07/19/2016, 8:24 PM

## 2016-07-20 ENCOUNTER — Encounter (HOSPITAL_COMMUNITY): Payer: Self-pay | Admitting: *Deleted

## 2016-07-20 DIAGNOSIS — O24429 Gestational diabetes mellitus in childbirth, unspecified control: Secondary | ICD-10-CM

## 2016-07-20 DIAGNOSIS — Z3A36 36 weeks gestation of pregnancy: Secondary | ICD-10-CM

## 2016-07-20 MED ORDER — MEDROXYPROGESTERONE ACETATE 150 MG/ML IM SUSP
150.0000 mg | Freq: Once | INTRAMUSCULAR | Status: AC
  Administered 2016-07-20: 150 mg via INTRAMUSCULAR
  Filled 2016-07-20: qty 1

## 2016-07-20 MED ORDER — SENNOSIDES-DOCUSATE SODIUM 8.6-50 MG PO TABS: 2.0000 | ORAL_TABLET | Freq: Every evening | ORAL | 0 refills | Status: AC | PRN

## 2016-07-20 MED ORDER — IBUPROFEN 600 MG PO TABS: 600.0000 mg | ORAL_TABLET | Freq: Four times a day (QID) | ORAL | 0 refills | Status: AC

## 2016-07-20 NOTE — Lactation Note (Signed)
This note was copied from a baby's chart. Lactation Consultation Note: Spanish Interpreter at bedside with all teaching. Mother states that breastfeeding is going well. Mother states she has slight pain when latching. Mother has a hand pump. She was advised to continue to cue base feed and allow for cluster feeding. Mother advised to continue to supplement infant with any amt she pump and add formula for extra calories. Mother advised in treastment and prevention of engorgement. Mother receptive to all teaching. [facility].  Patient Name: Michelle Rice JXBJY'NToday's Date: 07/20/2016     Maternal Data    Feeding Feeding Type: Formula Nipple Type: Slow - flow  LATCH Score/Interventions                      Lactation Tools Discussed/Used     Consult Status      Michel BickersKendrick, Jan Olano McCoy 07/20/2016, 10:11 AM

## 2016-07-20 NOTE — Discharge Instructions (Signed)

## 2016-07-20 NOTE — Discharge Summary (Signed)
OB Discharge Summary     Patient Name: Michelle Rice DOB: 08/11/1981 MRN: [id number]  Date of admission: 07/18/2016 Delivering MD: Jen Mow [facility]   Date of discharge: 07/20/2016  Admitting diagnosis: 36 WEEKS CTX  Intrauterine pregnancy: 102w1d     Secondary diagnosis:  Active Problems:   Active preterm labor   Vaginal delivery  Additional problems: A1GDM     Discharge diagnosis: Preterm Pregnancy Delivered                                                                                                Post partum procedures:none  Augmentation: None  Complications: None  Hospital course:  Onset of Labor With Vaginal Delivery     35 y.o. yo [id number] at [redacted]w[redacted]d was admitted in Active Labor on 07/18/2016. Patient had an uncomplicated labor course as follows:  Membrane Rupture Time/Date: 1:57 AM ,07/18/2016   Intrapartum Procedures: Episiotomy:                                           Lacerations:  1st degree [2]  Patient had a delivery of a Viable infant. 07/18/2016  Information for the patient's newborn:  Michelle Rice [[id number]]  Delivery Method: Vag-Spont    Pateint had an uncomplicated postpartum course.  She is ambulating, tolerating a regular diet, passing flatus, and urinating well. Patient is discharged home in stable condition on 07/20/16.    Physical exam Vitals:   07/18/16 0900 07/19/16 0559 07/19/16 1727 07/20/16 0600  BP: (!) 102/56 109/72 (!) 110/52 116/72  Pulse: 68 61 82 72  Resp: 18 16 17 20   Temp: 98.7 F (37.1 C) 98.3 F (36.8 C) 98.2 F (36.8 C) 98.1 F (36.7 C)  TempSrc: Oral Oral Oral Oral  Height:       General: alert and cooperative Lochia: appropriate Uterine Fundus: firm Incision: N/A DVT Evaluation: No evidence of DVT seen on physical exam. Labs: Lab Results  Component Value Date   WBC 16.7 (H) 07/18/2016   HGB 12.1 07/18/2016   HCT 34.7 (L) 07/18/2016   MCV 80.1 07/18/2016   PLT 216 07/18/2016    CMP Latest Ref Rng & Units 05/22/2016  Glucose 65 - 99 mg/dL 70  BUN 7 - 25 mg/dL 9  Creatinine 6.57 - 8.46 mg/dL 9.62  Sodium 952 - 841 mmol/L 138  Potassium 3.5 - 5.3 mmol/L 4.4  Chloride 98 - 110 mmol/L 105  CO2 20 - 31 mmol/L 23  Calcium 8.6 - 10.2 mg/dL 8.6  Total Protein 6.1 - 8.1 g/dL 6.2  Total Bilirubin 0.2 - 1.2 mg/dL 0.2  Alkaline Phos 33 - 115 U/L 71  AST 10 - 30 U/L 11  ALT 6 - 29 U/L 9    Discharge instruction: per After Visit Summary and "Baby and Me Booklet".  After visit meds:    Medication List    STOP taking these medications   aspirin EC 81 MG tablet     TAKE these medications  ibuprofen 600 MG tablet Commonly known as:  ADVIL,MOTRIN Take 1 tablet (600 mg total) by mouth every 6 (six) hours.   prenatal multivitamin Tabs tablet Take 1 tablet by mouth daily at 12 noon.   senna-docusate 8.6-50 MG tablet Commonly known as:  Senokot-S Take 2 tablets by mouth at bedtime as needed for mild constipation or moderate constipation.       Diet: routine diet  Activity: Advance as tolerated. Pelvic rest for 6 weeks.   Outpatient follow up:6 weeks Follow up Appt:Future Appointments Date Time Provider Department Center  08/29/2016 2:40 PM Michelle Rice, CNM [facility]   Follow up Visit:No Follow-up on file.  Postpartum contraception: Vasectomy , with Depo prior to d/c as a bridge method  Newborn Data: Live born female  Birth Weight: 5 lb 15.6 oz (2710 g) APGAR: 7, 8  Baby Feeding: Breast Disposition:home with mother   07/20/2016 Michelle MaiersAsiyah Z Mikell, MD  CNM attestation I have seen and examined this patient and agree with above documentation in the resident's note.   Michelle Rice is a 35 y.o. [id number] s/p SVD.   Pain is well controlled.  Plan for birth control is Depo-Provera, vasectomy.  Method of Feeding: breast  PE:  BP 116/72 (BP Location: Right Arm)   Pulse 72   Temp 98.1 F (36.7 C) (Oral)   Resp 20   Ht 5\' 2"  (1.575 m)    LMP 10/23/2015 (Exact Date)  Fundus firm   Recent Labs  07/18/16 0624  HGB 12.1  HCT 34.7*     Plan: discharge today - postpartum care discussed - f/u clinic in 6 weeks for postpartum visit   Michelle Rice, Michelle Rice, CNM 9:26 AM  07/20/2016

## 2016-07-22 ENCOUNTER — Ambulatory Visit: Payer: Self-pay

## 2016-07-22 NOTE — Lactation Note (Signed)
This note was copied from a baby's chart. Lactation Consultation Note  Patient Name: Michelle Rice ZESPQ'Z Date: 07/22/2016 Reason for consult: Follow-up assessment;Late preterm infant;Infant < 6lbs Infant is 65 days old & seen by [facility] for follow-up assessment with Spanish Interpreter at bed-side throughout consultation. Mom reports that she has been pumping q 3hrs and gets 50-90m. Mom has been giving 20-624mof either pumped milk or formula ~q 2hrs. Mom stated she has been trying to latch baby when he shows feeding cues but he won't latch so she gives him the bottle & pumps afterwards. Encouraged mom to ask for lactation at next feeding to assist with latch. [facility] & states she plans to BF & [facility] so discussed how Alimentum is meant to be a bridge to get mom to exclusively BF and that if her plan is to do both that baby could be switched to regular formula. Encouraged mom to talk with the pediatrician to see what formula he should be on when discharged and if it's Alimentum that she should get a prescription [facility]; [facility] from [facility] and stated she knows how to convert pump kit to pump both breasts at the same time and stated she will do that [facility]. Mom reports no questions at this time.  Maternal Data    Feeding Feeding Type: Bottle Fed - Formula Nipple Type: Slow - flow  LATCH Score/Interventions                      Lactation Tools Discussed/Used WIC Program: Yes   Consult Status Consult Status: Follow-up Date: 07/23/16 Follow-up type: In-patient    LaYvonna Alanis/26/2017, 2:22 PM

## 2016-07-23 ENCOUNTER — Ambulatory Visit: Payer: Self-pay

## 2016-07-23 NOTE — Lactation Note (Signed)
This note was copied from a baby's chart. Lactation Consultation Note  Patient Name: Michelle Rice ZOXWR'UToday's Date: 07/23/2016   Visited with Michelle Rice, baby 715 days old.  Michelle Rice using the [employer] DEBP every 3 hrs, obtaining 60-90 ml each time.  Baby being bottle fed breast milk when LC entered the room.  Encouraged her to continue pumping to support her milk supply.  Michel[facility], as she is fine with a manual pump.  Reminded Michelle Rice that her breast milk is very plentiful now as she is using a hospital grade DEBP.   [facility], so does not want to take a pump.  Explained that her breast milk is much better for baby, and she probably wouldn't need formula if she had a strong pump and pumped >8 times in 24 hrs.  Also, as baby grows, and bilirubin level drops, he should be able to breast feed.  Michelle Rice just smiled.  I reminded her of OP lactation appointments, and encouraged her to call if she goes home and has any questions.  She said he would.  No questions at this time.  Judee ClaraSmith, Zyona Pettaway E 07/23/2016, 12:44 PM

## 2016-08-01 ENCOUNTER — Encounter (HOSPITAL_COMMUNITY): Payer: Self-pay | Admitting: Anesthesiology

## 2016-08-01 MED ORDER — CEFAZOLIN SODIUM-DEXTROSE 2-4 GM/100ML-% IV SOLN: 2.0000 g | INTRAVENOUS | Status: DC

## 2016-08-01 NOTE — Anesthesia Preprocedure Evaluation (Deleted)
Anesthesia Evaluation  Patient identified by MRN, date of birth, ID band Patient awake    Reviewed: Allergy & Precautions, H&P , Patient's Chart, lab work & pertinent test results  Airway Mallampati: II  TM Distance: >3 FB Neck ROM: full    Dental no notable dental hx.    Pulmonary    Pulmonary exam normal breath sounds clear to auscultation       Cardiovascular Exercise Tolerance: Good  Rhythm:regular Rate:Normal     Neuro/Psych    GI/Hepatic   Endo/Other    Renal/GU      Musculoskeletal   Abdominal   Peds  Hematology   Anesthesia Other Findings Prev CS Hx of GDM... No Meds  Reproductive/Obstetrics                             Anesthesia Physical Anesthesia Plan  ASA: II  Anesthesia Plan: Spinal   Post-op Pain Management:    Induction:   Airway Management Planned:   Additional Equipment:   Intra-op Plan:   Post-operative Plan:   Informed Consent: I have reviewed the patients History and Physical, chart, labs and discussed the procedure including the risks, benefits and alternatives for the proposed anesthesia with the patient or authorized representative who has indicated his/her understanding and acceptance.   Dental Advisory Given  Plan Discussed with: CRNA  Anesthesia Plan Comments: (Lab work confirmed with CRNA in room. Platelets okay. Discussed spinal anesthetic, and patient consents to the procedure:  included risk of possible headache,backache, failed block, allergic reaction, and nerve injury. This patient was asked if she had any questions or concerns before the procedure started. )        Anesthesia Quick Evaluation

## 2016-08-02 ENCOUNTER — Inpatient Hospital Stay (HOSPITAL_COMMUNITY): Admission: RE | Admit: 2016-08-02 | Payer: MEDICAID | Source: Ambulatory Visit | Admitting: Family Medicine

## 2016-08-02 ENCOUNTER — Encounter (HOSPITAL_COMMUNITY): Admission: RE | Payer: Self-pay | Source: Ambulatory Visit

## 2016-08-02 SURGERY — Surgical Case
Anesthesia: Regional | Laterality: Bilateral

## 2016-08-29 ENCOUNTER — Ambulatory Visit: Payer: Self-pay | Admitting: Obstetrics and Gynecology

## 2017-04-26 IMAGING — US US OB COMP LESS 14 WK
1 series · 15 of 28 positions shown · non-contrast
Comparison: None.

CLINICAL DATA: Vaginal bleeding

EXAM:
OBSTETRIC <14 WK ULTRASOUND
TECHNIQUE: Transabdominal ultrasound was performed for evaluation of the
gestation as well as the maternal uterus and adnexal regions.

[Series 1: us ob comp less 14 wk · 15 of 43 slices shown]
[im 1/43]
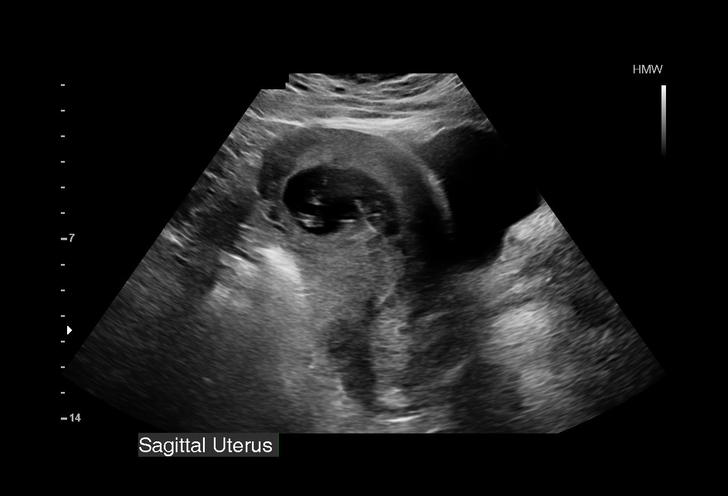
[im 4/43]
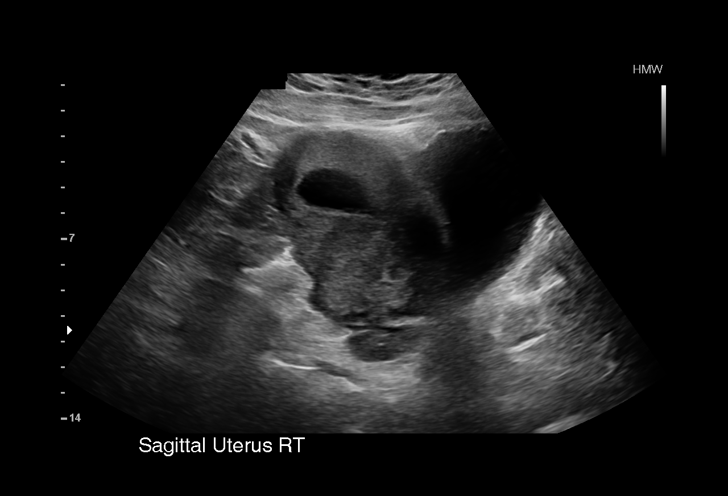
[im 7/43]
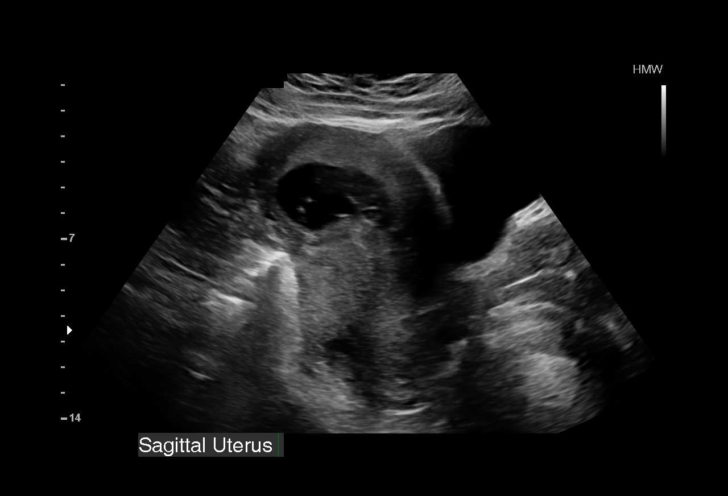
[im 10/43]
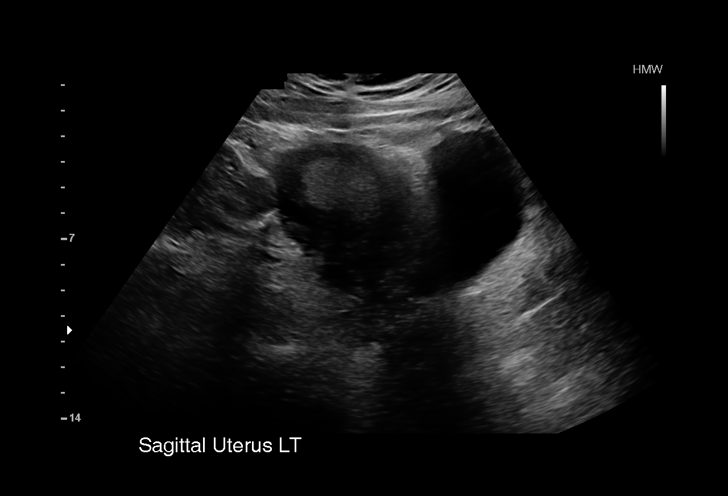
[im 13/43]
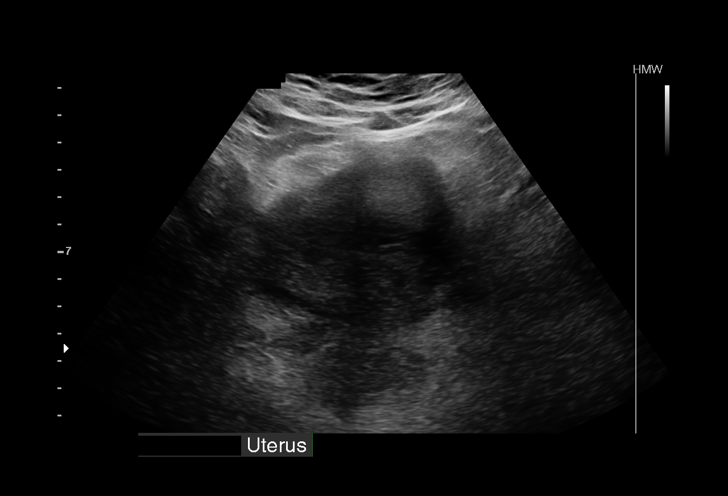
[im 16/43]
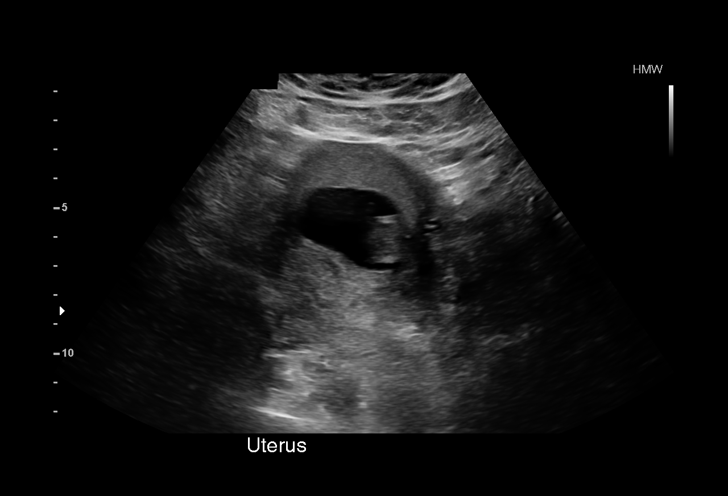
[im 19/43]
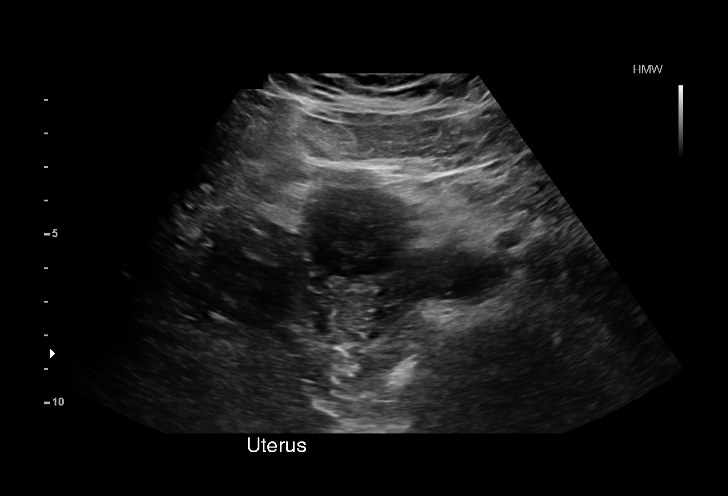
[im 22/43]
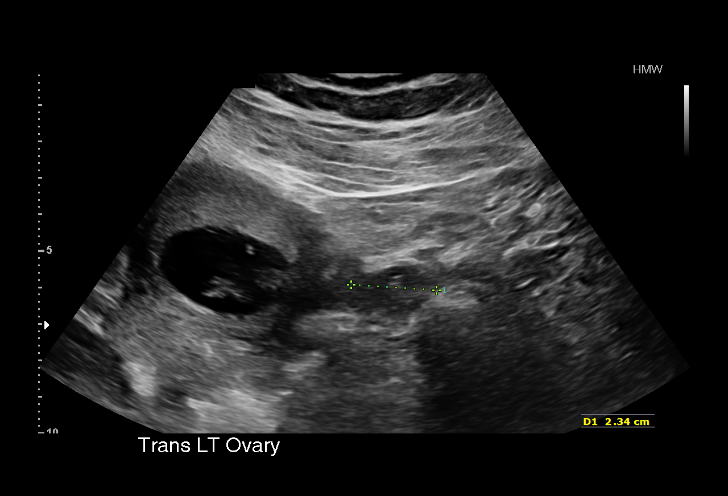
[im 24/43]
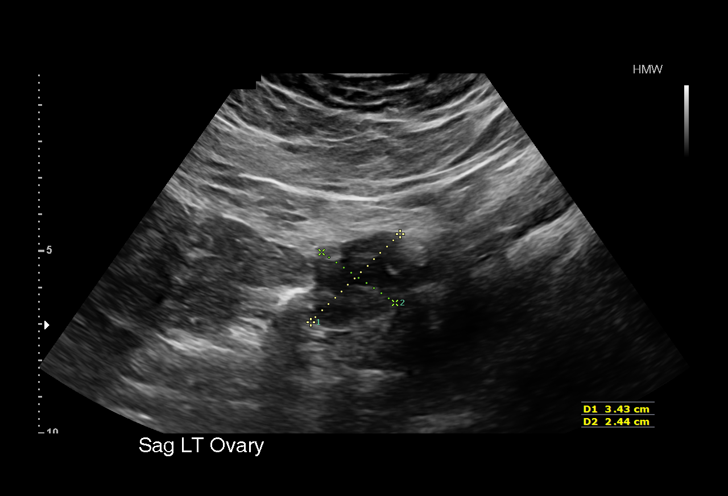
[im 27/43]
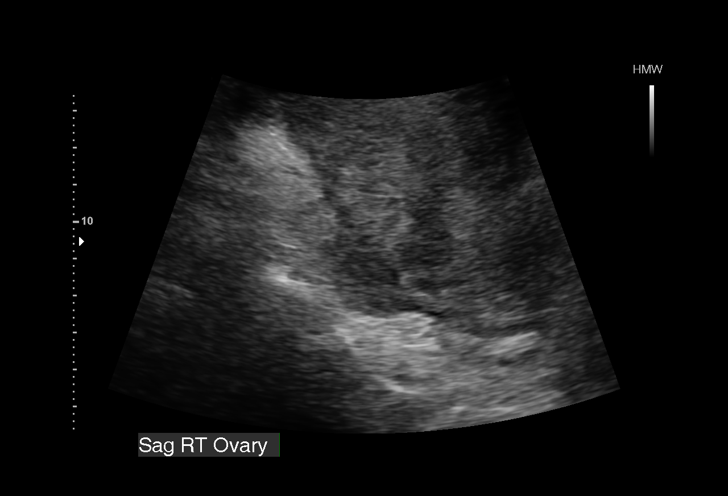
[im 30/43]
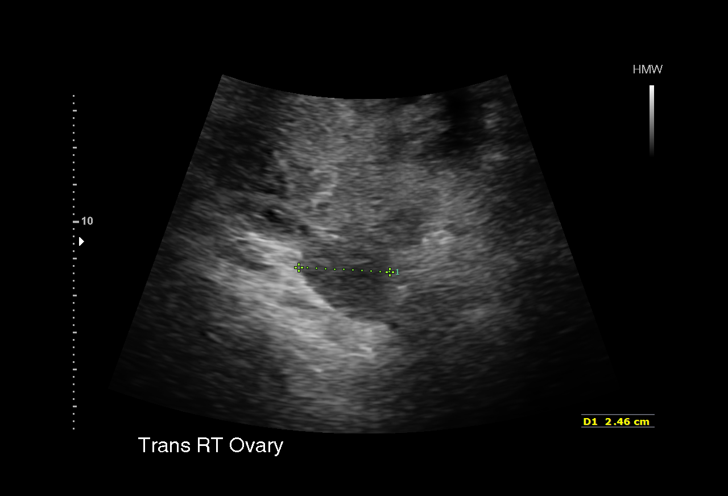
[im 33/43]
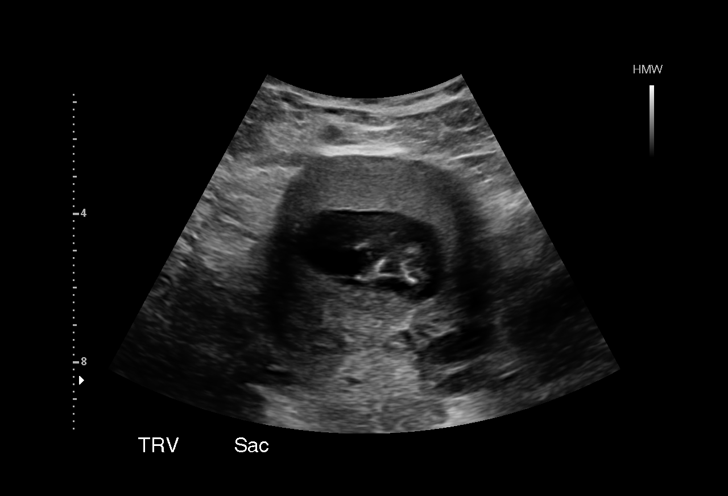
[im 36/43]
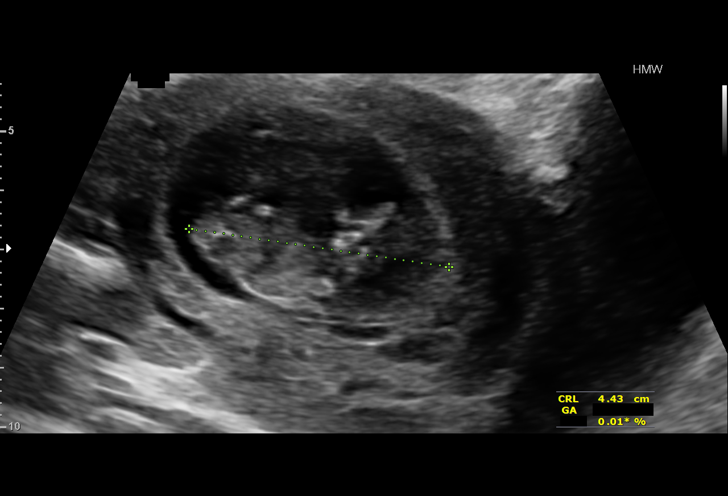
[im 39/43]
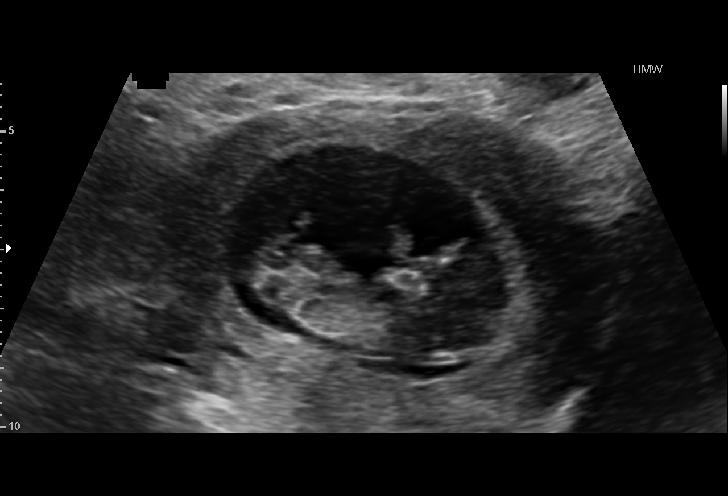
[im 43/43]
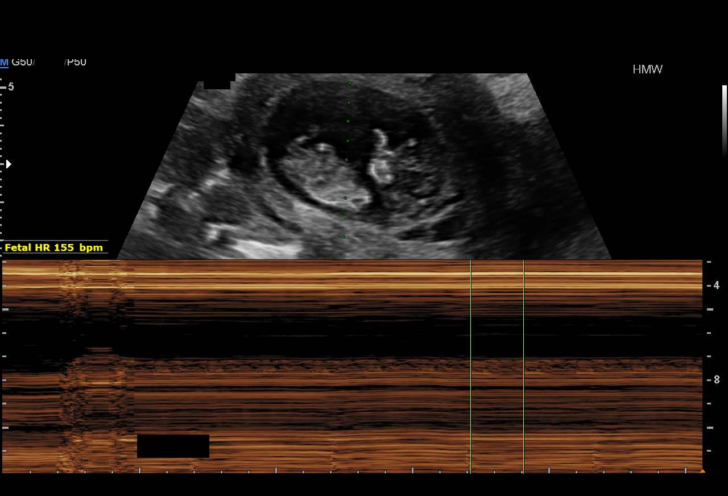

[15 of 28 positions shown; findings below may reference images not displayed]

FINDINGS: Intrauterine gestational sac: Visualized/normal in shape.

Yolk sac:  Not present

Embryo:  Present

Cardiac Activity: Present

Heart Rate: 155 bpm

CRL:   45.4  mm   11 w 2 d                  US EDC: 08/09/2016

Subchorionic hemorrhage: Small area of decreased echogenicity is
noted adjacent to the gestational sac consistent with a subchorionic
hemorrhage. This measures approximately 2.3 by 0.7 cm.

Maternal uterus/adnexae: Within normal limits
IMPRESSION: Single live intrauterine gestation at 11 weeks 2 days. Small
subchorionic hemorrhage is noted.

## 2019-11-03 ENCOUNTER — Other Ambulatory Visit: Payer: Self-pay

## 2019-11-03 DIAGNOSIS — Z20822 Contact with and (suspected) exposure to covid-19: Secondary | ICD-10-CM

## 2019-11-04 LAB — NOVEL CORONAVIRUS, NAA: SARS-CoV-2, NAA: NOT DETECTED

## 2019-11-05 ENCOUNTER — Telehealth: Payer: Self-pay | Admitting: *Deleted

## 2019-11-05 NOTE — Telephone Encounter (Signed)
Patient called for assistance with my chart .
